# Patient Record
Sex: Female | Born: 1985 | Race: White | Hispanic: Yes | Marital: Single | Smoking: Never smoker
Health system: Southern US, Community
[De-identification: ages and names within clinical notes are randomized; demographics above are authoritative.]

## PROBLEM LIST (undated history)

## (undated) ENCOUNTER — Inpatient Hospital Stay (HOSPITAL_COMMUNITY): Payer: Self-pay

## (undated) DIAGNOSIS — G709 Myoneural disorder, unspecified: Secondary | ICD-10-CM

## (undated) DIAGNOSIS — R87629 Unspecified abnormal cytological findings in specimens from vagina: Secondary | ICD-10-CM

## (undated) DIAGNOSIS — T783XXA Angioneurotic edema, initial encounter: Secondary | ICD-10-CM

## (undated) HISTORY — DX: Unspecified abnormal cytological findings in specimens from vagina: R87.629

## (undated) HISTORY — DX: Angioneurotic edema, initial encounter: T78.3XXA

---

## 2005-01-22 ENCOUNTER — Emergency Department (HOSPITAL_COMMUNITY): Admission: EM | Admit: 2005-01-22 | Discharge: 2005-01-23 | Payer: Self-pay | Admitting: Emergency Medicine

## 2005-01-24 ENCOUNTER — Inpatient Hospital Stay (HOSPITAL_COMMUNITY): Admission: EM | Admit: 2005-01-24 | Discharge: 2005-01-27 | Payer: Self-pay | Admitting: Emergency Medicine

## 2005-01-25 ENCOUNTER — Ambulatory Visit: Payer: Self-pay | Admitting: Internal Medicine

## 2017-03-22 ENCOUNTER — Inpatient Hospital Stay (HOSPITAL_COMMUNITY): Payer: Self-pay

## 2017-03-22 ENCOUNTER — Inpatient Hospital Stay (HOSPITAL_COMMUNITY)
Admission: AD | Admit: 2017-03-22 | Discharge: 2017-03-22 | Disposition: A | Discharge status: Home / Self Care | Payer: Self-pay | Source: Ambulatory Visit | Attending: Family Medicine | Admitting: Family Medicine

## 2017-03-22 ENCOUNTER — Encounter (HOSPITAL_COMMUNITY): Payer: Self-pay

## 2017-03-22 DIAGNOSIS — O26899 Other specified pregnancy related conditions, unspecified trimester: Secondary | ICD-10-CM

## 2017-03-22 DIAGNOSIS — O26893 Other specified pregnancy related conditions, third trimester: Secondary | ICD-10-CM | POA: Insufficient documentation

## 2017-03-22 DIAGNOSIS — R109 Unspecified abdominal pain: Secondary | ICD-10-CM | POA: Insufficient documentation

## 2017-03-22 DIAGNOSIS — M6208 Separation of muscle (nontraumatic), other site: Secondary | ICD-10-CM

## 2017-03-22 DIAGNOSIS — Z3A28 28 weeks gestation of pregnancy: Secondary | ICD-10-CM | POA: Insufficient documentation

## 2017-03-22 DIAGNOSIS — N949 Unspecified condition associated with female genital organs and menstrual cycle: Secondary | ICD-10-CM

## 2017-03-22 DIAGNOSIS — R102 Pelvic and perineal pain: Secondary | ICD-10-CM | POA: Insufficient documentation

## 2017-03-22 HISTORY — DX: Myoneural disorder, unspecified: G70.9

## 2017-03-22 LAB — CBC
HCT: 36.6 % (ref 36.0–46.0)
HEMOGLOBIN: 12.2 g/dL (ref 12.0–15.0)
MCH: 29.3 pg (ref 26.0–34.0)
MCHC: 33.3 g/dL (ref 30.0–36.0)
MCV: 87.8 fL (ref 78.0–100.0)
PLATELETS: 203 10*3/uL (ref 150–400)
RBC: 4.17 MIL/uL (ref 3.87–5.11)
RDW: 13.6 % (ref 11.5–15.5)
WBC: 11.7 10*3/uL — ABNORMAL HIGH (ref 4.0–10.5)

## 2017-03-22 LAB — COMPREHENSIVE METABOLIC PANEL
ALBUMIN: 3.1 g/dL — AB (ref 3.5–5.0)
ALK PHOS: 71 U/L (ref 38–126)
ALT: 15 U/L (ref 14–54)
ANION GAP: 9 (ref 5–15)
AST: 15 U/L (ref 15–41)
BUN: 8 mg/dL (ref 6–20)
CHLORIDE: 104 mmol/L (ref 101–111)
CO2: 25 mmol/L (ref 22–32)
CREATININE: 0.56 mg/dL (ref 0.44–1.00)
Calcium: 9 mg/dL (ref 8.9–10.3)
GFR calc non Af Amer: >60 mL/min (ref >60)
GLUCOSE: 86 mg/dL (ref 65–99)
POTASSIUM: 4.5 mmol/L (ref 3.5–5.1)
SODIUM: 138 mmol/L (ref 135–145)
TOTAL PROTEIN: 7.1 g/dL (ref 6.5–8.1)
Total Bilirubin: 0.3 mg/dL (ref 0.3–1.2)

## 2017-03-22 LAB — ABO/RH: ABO/RH(D): O POS

## 2017-03-22 LAB — URINALYSIS, ROUTINE W REFLEX MICROSCOPIC
BILIRUBIN URINE: NEGATIVE
GLUCOSE, UA: NEGATIVE mg/dL
Hgb urine dipstick: NEGATIVE
KETONES UR: NEGATIVE mg/dL
LEUKOCYTES UA: NEGATIVE
Nitrite: NEGATIVE
PH: 6 (ref 5.0–8.0)
PROTEIN: NEGATIVE mg/dL
Specific Gravity, Urine: 1.014 (ref 1.005–1.030)

## 2017-03-22 MED ORDER — GI COCKTAIL ~~LOC~~
30.0000 mL | Freq: Once | ORAL | Status: AC
  Administered 2017-03-22: 30 mL via ORAL
  Filled 2017-03-22: qty 30

## 2017-03-22 NOTE — MAU Note (Signed)
Patient woke up at 0100 with sharp pain 10/10 across her mid and upper abdomen.  Hurts when she's trying to sit up.  No leaking fluid or bleeding. + FM.

## 2017-03-22 NOTE — MAU Provider Note (Signed)
History     CSN: [id number]  Arrival date and time: 03/22/17 5035   First Provider Initiated Contact with Patient 03/22/17 [phone]      Chief Complaint  Patient presents with  . Abdominal Pain   Paula Dixon is a 31 y.o. G1P0 at [redacted]w[redacted]d who presents today with abdominal pain that started about 3 hours prior to arrival. She denies any VB or LOF. She reports normal fetal movement. She was getting prenatal care in [place], and she just recently moved here. She had [place] on 8/7 that showed placenta previa. She states that on the [place] on 02/07/17 the previa had moved, but patient was advised to stay on bedrest.    Abdominal Pain  This is a new problem. The current episode started today. The onset quality is sudden. The problem occurs constantly. The problem has been unchanged. The pain is located in the epigastric region and periumbilical region. The pain is at a severity of 10/10. The quality of the pain is sharp. The abdominal pain does not radiate. Pertinent negatives include no dysuria, fever, frequency, nausea or vomiting. Exacerbated by: possible suspect food intake.  The pain is relieved by nothing.   Past Medical History:  Diagnosis Date  . Neuromuscular disorder ([place])     No past surgical history on file.  No family history on file.  Social History  Substance Use Topics  . Smoking status: Never Smoker  . Smokeless tobacco: Not on file  . Alcohol use Yes     Comment: sip of wine    Allergies: Not on File  No prescriptions prior to admission.    Review of Systems  Constitutional: Negative for chills and fever.  Gastrointestinal: Positive for abdominal pain. Negative for nausea and vomiting.  Genitourinary: Negative for dysuria, frequency, pelvic pain, urgency, vaginal bleeding and vaginal discharge.   Physical Exam   Blood pressure 116/68, pulse 91, temperature 97.9 F (36.6 C), temperature source Oral, resp. rate 18, height 5\' 4"  (1.626 m), weight 231 lb (104.8  kg).  Physical Exam  Nursing note and vitals reviewed. Constitutional: She is oriented to person, place, and time. She appears well-developed and well-nourished. No distress.  HENT:  Head: Normocephalic.  Cardiovascular: Normal rate.   Respiratory: Effort normal.  GI: Soft. There is no tenderness. There is no rebound.  Neurological: She is alert and oriented to person, place, and time.  Skin: Skin is warm and dry.  Psychiatric: She has a normal mood and affect.   [place]: Cervical length: 4cm AFI 69%tile Placenta: no previa, above cervical os.    Results for orders placed or performed during the hospital encounter of 03/22/17 (from the past 24 hour(s))  Urinalysis, Routine w reflex microscopic     Status: None   Collection Time: 03/22/17  3:23 AM  Result Value Ref Range   Color, Urine YELLOW YELLOW   APPearance CLEAR CLEAR   Specific Gravity, Urine 1.014 1.005 - 1.030   pH 6.0 5.0 - 8.0   Glucose, UA NEGATIVE NEGATIVE mg/dL   Hgb urine dipstick NEGATIVE NEGATIVE   Bilirubin Urine NEGATIVE NEGATIVE   Ketones, ur NEGATIVE NEGATIVE mg/dL   Protein, ur NEGATIVE NEGATIVE mg/dL   Nitrite NEGATIVE NEGATIVE   Leukocytes, UA NEGATIVE NEGATIVE  CBC     Status: Abnormal   Collection Time: 03/22/17  3:58 AM  Result Value Ref Range   WBC 11.7 (H) 4.0 - 10.5 K/uL   RBC 4.17 3.87 - 5.11 MIL/uL   Hemoglobin 12.2 12.0 -  15.0 g/dL   HCT 36.6 36.0 - 46.0 %   MCV 87.8 78.0 - 100.0 fL   MCH 29.3 26.0 - 34.0 pg   MCHC 33.3 30.0 - 36.0 g/dL   RDW 13.6 11.5 - 15.5 %   Platelets 203 150 - 400 K/uL   FHT: 130, moderate with 10x10 accels, no decels Toco: no UCs  MAU Course  Procedures  MDM   Assessment and Plan   1. Abdominal pain during pregnancy in third trimester   2. Pelvic pain affecting pregnancy   3. Round ligament pain   4. Diastasis recti   5. [redacted] weeks gestation of pregnancy    DC home Comfort measures reviewed  3rd Trimester precautions  PTL precautions  Fetal kick  counts RX: none Return to MAU as needed FU with OB as planned  [place] [facility] Follow up.   Specialty:  Obstetrics and Gynecology Why:  Call for an appointment  Contact information: [place] 03/22/2017, 3:49 AM

## 2017-03-22 NOTE — Discharge Instructions (Signed)
Prenatal Care Providers [facility]  Phone209 [phone]     Phone: [place]                      [facility]  @[place]     Phone: [phone]  Phone: [place]     Phone: [phone]  Phone: [place] [facility] @ [place]                hone: [phone]  Phone: [phone]         [facility] [address]      Phone: [phone]  Phone: [phone]         [place] [facility]                Phone: [phone]  [place])          Phone: [phone] [facility]   Phone: [phone] Round Ligament Pain during Pregnancy Many women will experience a type of pain referred to as round ligament pain during their pregnancy. This is associated with abdominal pain or discomfort. Since any type of abdominal pain during pregnancy can be disconcerting, it is important to talk about round ligament pain to relieve any anxiety or fears you may have regarding the symptoms you are feeling. Round ligament pain is due to normal changes that take place in the body during pregnancy. It is caused by stretching of the round ligaments attached to the uterus. More commonly it occurs on the right side of the pelvis. Round Ligament: An Overview Typically in the non-pregnant state the uterus is about the size of an apple or pear. There are thick ligaments which hold the uterus in place in the abdomen, referred to as round ligaments. During pregnancy, your uterus will expand in size and weight, and the ligaments supporting it will have to stretch, becoming longer and thinner. As these ligaments pull and tug they may irritate nearby nerve fibers, which causes pain. The severity of the  pain in some cases can seem extreme. Some common symptoms of round ligament pain include:  Ligament spasms or contractions/cramps that trigger a sharp pain typically on the right side of the abdomen.  Pain upon waking or suddenly rolling over in your sleep.  Pain in the abdomen that is sharp brought on by exercise or other vigorous activity. Similar Problems Round ligament pain is often mistaken for other medical conditions because the symptoms are similar. Acute abdominal pain during pregnancy may also be a sign of other conditions including:  Abdominal cramps - Some abdominal pain is simply caused by change in bowel habits associated with pregnancy. Gas is a common problem that can cause sharp, shooting pain. You should always seek out medical care if your pain is accompanied by fever, chills, pain upon urination or if you have difficulty walking. Further exams and tests will be conducted to ensure that you do not have a more serious condition. It is not uncommon for women with lower abdominal pain to have a urinary tract infection, thus you may also be asked for a urine sample.  Treatment If all other conditions are ruled out you can treat your round ligament pain relatively easily. You may be advised to take some acetaminophen (Tylenol) to reduce the severity of any persistent pain and asked to reduce your activity level. You can apply a heating pad to the area of pain or take a warm bath. Lying on the opposite side of the pain may help as well. Most women will find relief from round ligament pain simply by altering their daily routines slightly. The good news is round ligament pain will disappear completely once you have given birth to your child!

## 2017-09-01 ENCOUNTER — Encounter (HOSPITAL_COMMUNITY): Payer: Self-pay

## 2018-04-24 ENCOUNTER — Encounter: Payer: Self-pay | Admitting: Obstetrics & Gynecology

## 2019-10-04 ENCOUNTER — Other Ambulatory Visit: Payer: Self-pay

## 2019-10-04 ENCOUNTER — Other Ambulatory Visit (HOSPITAL_COMMUNITY)
Admission: RE | Admit: 2019-10-04 | Discharge: 2019-10-04 | Disposition: A | Discharge status: Home / Self Care | Payer: 59 | Source: Ambulatory Visit | Attending: Obstetrics & Gynecology | Admitting: Obstetrics & Gynecology

## 2019-10-04 ENCOUNTER — Encounter: Payer: Self-pay | Admitting: Obstetrics & Gynecology

## 2019-10-04 ENCOUNTER — Ambulatory Visit (INDEPENDENT_AMBULATORY_CARE_PROVIDER_SITE_OTHER): Payer: 59 | Admitting: Obstetrics & Gynecology

## 2019-10-04 VITALS — BP 108/73 | HR 73 | Ht 64.0 in | Wt 199.0 lb

## 2019-10-04 DIAGNOSIS — Z01419 Encounter for gynecological examination (general) (routine) without abnormal findings: Secondary | ICD-10-CM | POA: Insufficient documentation

## 2019-10-04 DIAGNOSIS — Z3202 Encounter for pregnancy test, result negative: Secondary | ICD-10-CM

## 2019-10-04 LAB — POCT URINE PREGNANCY: Preg Test, Ur: NEGATIVE

## 2019-10-04 NOTE — Progress Notes (Signed)
Subjective:     Brynnli Anzalone is a 34 y.o. female here for a routine exam. Current complaints: none.     Gynecologic History Patient's last menstrual period was 09/10/2019. Last Pap: 2018. Results were: normal Last mammogram: n/a  Obstetric History OB History  Gravida Para Term Preterm AB Living  3 1 1   2 1   SAB TAB Ectopic Multiple Live Births    1 1   1     # Outcome Date GA Lbr Len/2nd Weight Sex Delivery Anes PTL Lv  3 TAB 2020          2 Term 2018 [redacted]w[redacted]d   F Vag-Spont EPI N LIV  1 Ectopic 2016           The following portions of the patient's history were reviewed and updated as appropriate: allergies, current medications, past family history, past medical history, past social history, past surgical history and problem list.  Review of Systems Pertinent items are noted in HPI.    Objective:  BP 108/73   Pulse 73   Ht 5\' 4"  (1.626 m)   Wt 199 lb (90.3 kg)   LMP 09/10/2019   BMI 34.16 kg/m  General Appearance:    Alert, cooperative, no distress, appears stated age  Head:    Normocephalic, without obvious abnormality, atraumatic  Eyes:    conjunctiva/corneas clear, EOM's intact, both eyes  Ears:    Normal external ear canals, both ears  Nose:   Nares normal, septum midline, mucosa normal, no drainage    or sinus tenderness  Throat:   Lips, mucosa, and tongue normal; teeth and gums normal  Neck:   Supple, symmetrical, trachea midline, no adenopathy;    thyroid:  no enlargement/tenderness/nodules  Back:     Symmetric, no curvature, ROM normal, no CVA tenderness  Lungs:     respirations unlabored  Chest Wall:    No tenderness or deformity   Heart:    Regular rate and rhythm  Breast Exam:    No tenderness, masses, or nipple abnormality  Abdomen:     Soft, non-tender, bowel sounds active all four quadrants,    no masses, no organomegaly  Genitalia:    Normal female without lesion, discharge or tenderness     Extremities:   Extremities normal, atraumatic, no cyanosis or  edema  Pulses:   2+ and symmetric all extremities  Skin:   Skin color, texture, turgor normal, no rashes or lesions   Mardelle was seen today for gynecologic exam.  Diagnoses and all orders for this visit:  Well female exam with routine gynecological exam -     Cytology - PAP( [place]) -     HIV antibody (with reflex) -     RPR -     Hepatitis C Antibody -     Hepatitis B Surface AntiGEN -     POCT urine pregnancy  f/u in 1 year or sooner prn   Vlada Uriostegui L. Harraway-Smith, M.D., Cherlynn June

## 2019-10-05 LAB — HEPATITIS C ANTIBODY: Hep C Virus Ab: <0.1 s/co ratio (ref 0.0–0.9)

## 2019-10-05 LAB — HIV ANTIBODY (ROUTINE TESTING W REFLEX): HIV Screen 4th Generation wRfx: NONREACTIVE

## 2019-10-05 LAB — RPR: RPR Ser Ql: NONREACTIVE

## 2019-10-05 LAB — HEPATITIS B SURFACE ANTIGEN: Hepatitis B Surface Ag: NEGATIVE

## 2019-10-07 LAB — CYTOLOGY - PAP
Chlamydia: NEGATIVE
Comment: NEGATIVE
Comment: NEGATIVE
Comment: NORMAL
High risk HPV: POSITIVE — AB
Neisseria Gonorrhea: NEGATIVE

## 2019-10-11 ENCOUNTER — Encounter: Payer: Self-pay | Admitting: Obstetrics & Gynecology

## 2019-10-18 ENCOUNTER — Ambulatory Visit (INDEPENDENT_AMBULATORY_CARE_PROVIDER_SITE_OTHER): Payer: 59 | Admitting: Obstetrics & Gynecology

## 2019-10-18 ENCOUNTER — Other Ambulatory Visit: Payer: Self-pay | Admitting: Obstetrics & Gynecology

## 2019-10-18 ENCOUNTER — Other Ambulatory Visit: Payer: Self-pay

## 2019-10-18 VITALS — BP 114/76 | HR 72 | Wt 199.0 lb

## 2019-10-18 DIAGNOSIS — Z3202 Encounter for pregnancy test, result negative: Secondary | ICD-10-CM | POA: Diagnosis not present

## 2019-10-18 DIAGNOSIS — Z3042 Encounter for surveillance of injectable contraceptive: Secondary | ICD-10-CM

## 2019-10-18 DIAGNOSIS — Z3009 Encounter for other general counseling and advice on contraception: Secondary | ICD-10-CM

## 2019-10-18 LAB — POCT URINE PREGNANCY: Preg Test, Ur: NEGATIVE

## 2019-10-18 MED ORDER — MEDROXYPROGESTERONE ACETATE 150 MG/ML IM SUSP: 150.0000 mg | Freq: Once | INTRAMUSCULAR | Status: DC

## 2019-10-18 MED ORDER — MEDROXYPROGESTERONE ACETATE 150 MG/ML IM SUSP: 150.0000 mg | INTRAMUSCULAR | 0 refills | Status: DC

## 2019-10-18 MED ORDER — DROSPIRENONE-ETHINYL ESTRADIOL 3-0.03 MG PO TABS: 1.0000 | ORAL_TABLET | Freq: Every day | ORAL | 11 refills | Status: DC

## 2019-10-18 MED ORDER — NORGESTIMATE-ETH ESTRADIOL 0.25-35 MG-MCG PO TABS: 1.0000 | ORAL_TABLET | Freq: Every day | ORAL | 11 refills | Status: DC

## 2019-10-18 NOTE — Progress Notes (Signed)
Pt presented for Depo Provera but, her insurance does not cover it and she wants to discuss other option. Reviewed all forms of birth control options available including abstinence; over the counter/barrier methods; hormonal contraceptive medication including pill, patch, ring, injection,contraceptive implant; hormonal and nonhormonal IUDs; permanent sterilization options including vasectomy and the various tubal sterilization modalities. Risks and benefits reviewed.  Questions were answered.  Information was given to patient to review. Pt opts for OCPs. She prev has increased appetite and depression on them but, wants to try them again.   The use of the oral contraceptive has been fully discussed with the patient. This includes the proper method to initiate (i.e. Sunday start after next normal menstrual onset versus same day start) and continue the pills, the need for regular compliance to ensure adequate contraceptive effect, the physiology which make the pill effective, the instructions for what to do in event of a missed pill, and warnings about anticipated minor side effects such as breakthrough spotting, nausea, breast tenderness, weight changes, acne, headaches, etc.  She has been told of the more serious potential side effects such as MI, stroke, and deep vein thrombosis, all of which are very unlikely.  She has been asked to report any signs of such serious problems immediately.  She should back up the pill with a condom during any cycle in which antibiotics are prescribed, and during the first cycle as well. The need for additional protection, such as a condom, to prevent exposure to sexually transmitted diseases has also been discussed- the patient has been clearly reminded that OCP's cannot protect her against diseases such as HIV and others. She understands and wishes to take the medication as prescribed.She was given a prescription for Yasmin and will return in 3 months for BP and contraceptive  check.  Total face-to-face time with patient was 20 min.  Greater than 50% was spent in counseling and coordination of care with the patient.   Mickey Hebel L. Harraway-Smith, M.D., [place]nn June

## 2019-10-22 ENCOUNTER — Other Ambulatory Visit: Payer: Self-pay | Admitting: Family Medicine

## 2019-10-22 ENCOUNTER — Telehealth: Payer: Self-pay

## 2019-10-22 DIAGNOSIS — G43909 Migraine, unspecified, not intractable, without status migrainosus: Secondary | ICD-10-CM

## 2019-10-22 NOTE — Telephone Encounter (Signed)
Called number listed and when I asked for the patient the person replies "no".  Kathrene Alu RN

## 2019-10-22 NOTE — Telephone Encounter (Signed)
-----   Message from Lavonia Drafts, MD sent at 10/21/2019  7:07 PM EDT ----- Please call pt. She had an abnormal PAP and needs a colpo.   Please arrange.   Thanks,  Clh-S

## 2019-10-30 NOTE — Telephone Encounter (Signed)
Letter mailed to patient. Kathrene Alu RN

## 2019-11-06 ENCOUNTER — Ambulatory Visit
Admission: RE | Admit: 2019-11-06 | Discharge: 2019-11-06 | Disposition: A | Discharge status: Home / Self Care | Payer: 59 | Source: Ambulatory Visit | Attending: Family Medicine | Admitting: Family Medicine

## 2019-11-06 ENCOUNTER — Other Ambulatory Visit: Payer: Self-pay

## 2019-11-06 DIAGNOSIS — G43909 Migraine, unspecified, not intractable, without status migrainosus: Secondary | ICD-10-CM

## 2019-11-18 ENCOUNTER — Other Ambulatory Visit: Payer: Self-pay

## 2019-11-18 ENCOUNTER — Emergency Department (HOSPITAL_BASED_OUTPATIENT_CLINIC_OR_DEPARTMENT_OTHER)
Admission: EM | Admit: 2019-11-18 | Discharge: 2019-11-18 | Disposition: A | Discharge status: Home / Self Care | Payer: 59 | Attending: Emergency Medicine | Admitting: Emergency Medicine

## 2019-11-18 ENCOUNTER — Encounter (HOSPITAL_BASED_OUTPATIENT_CLINIC_OR_DEPARTMENT_OTHER): Payer: Self-pay

## 2019-11-18 ENCOUNTER — Emergency Department (HOSPITAL_BASED_OUTPATIENT_CLINIC_OR_DEPARTMENT_OTHER): Payer: 59

## 2019-11-18 DIAGNOSIS — H53149 Visual discomfort, unspecified: Secondary | ICD-10-CM | POA: Diagnosis not present

## 2019-11-18 DIAGNOSIS — M62838 Other muscle spasm: Secondary | ICD-10-CM | POA: Diagnosis not present

## 2019-11-18 DIAGNOSIS — G43909 Migraine, unspecified, not intractable, without status migrainosus: Secondary | ICD-10-CM | POA: Diagnosis not present

## 2019-11-18 DIAGNOSIS — R0789 Other chest pain: Secondary | ICD-10-CM | POA: Diagnosis present

## 2019-11-18 LAB — CBC
HCT: 42.4 % (ref 36.0–46.0)
Hemoglobin: 13.6 g/dL (ref 12.0–15.0)
MCH: 29.4 pg (ref 26.0–34.0)
MCHC: 32.1 g/dL (ref 30.0–36.0)
MCV: 91.8 fL (ref 80.0–100.0)
Platelets: 235 10*3/uL (ref 150–400)
RBC: 4.62 MIL/uL (ref 3.87–5.11)
RDW: 12.5 % (ref 11.5–15.5)
WBC: 6.1 10*3/uL (ref 4.0–10.5)
nRBC: 0 % (ref 0.0–0.2)

## 2019-11-18 LAB — BASIC METABOLIC PANEL
Anion gap: 7 (ref 5–15)
BUN: 13 mg/dL (ref 6–20)
CO2: 28 mmol/L (ref 22–32)
Calcium: 8.9 mg/dL (ref 8.9–10.3)
Chloride: 103 mmol/L (ref 98–111)
Creatinine, Ser: 0.78 mg/dL (ref 0.44–1.00)
GFR calc Af Amer: >60 mL/min (ref >60)
GFR calc non Af Amer: >60 mL/min (ref >60)
Glucose, Bld: 81 mg/dL (ref 70–99)
Potassium: 3.5 mmol/L (ref 3.5–5.1)
Sodium: 138 mmol/L (ref 135–145)

## 2019-11-18 LAB — HCG, SERUM, QUALITATIVE: Preg, Serum: NEGATIVE

## 2019-11-18 LAB — TROPONIN I (HIGH SENSITIVITY): Troponin I (High Sensitivity): <2 ng/L (ref <18)

## 2019-11-18 MED ORDER — DEXAMETHASONE SODIUM PHOSPHATE 10 MG/ML IJ SOLN
10.0000 mg | Freq: Once | INTRAMUSCULAR | Status: AC
  Administered 2019-11-18: 10 mg via INTRAVENOUS
  Filled 2019-11-18: qty 1

## 2019-11-18 MED ORDER — METHOCARBAMOL 500 MG PO TABS
500.0000 mg | ORAL_TABLET | Freq: Once | ORAL | Status: AC
  Administered 2019-11-18: 500 mg via ORAL
  Filled 2019-11-18: qty 1

## 2019-11-18 MED ORDER — METOCLOPRAMIDE HCL 5 MG/ML IJ SOLN
10.0000 mg | Freq: Once | INTRAMUSCULAR | Status: AC
  Administered 2019-11-18: 10 mg via INTRAVENOUS
  Filled 2019-11-18: qty 2

## 2019-11-18 MED ORDER — SODIUM CHLORIDE 0.9 % IV BOLUS
1000.0000 mL | Freq: Once | INTRAVENOUS | Status: AC
  Administered 2019-11-18: 1000 mL via INTRAVENOUS

## 2019-11-18 MED ORDER — DIPHENHYDRAMINE HCL 50 MG/ML IJ SOLN
25.0000 mg | Freq: Once | INTRAMUSCULAR | Status: AC
  Administered 2019-11-18: 25 mg via INTRAVENOUS
  Filled 2019-11-18: qty 1

## 2019-11-18 MED ORDER — KETOROLAC TROMETHAMINE 30 MG/ML IJ SOLN
30.0000 mg | Freq: Once | INTRAMUSCULAR | Status: AC
  Administered 2019-11-18: 30 mg via INTRAVENOUS
  Filled 2019-11-18: qty 1

## 2019-11-18 MED ORDER — METHOCARBAMOL 500 MG PO TABS
500.0000 mg | ORAL_TABLET | Freq: Two times a day (BID) | ORAL | 0 refills | Status: DC
Start: 2019-11-18 — End: 2020-10-08

## 2019-11-18 NOTE — ED Provider Notes (Signed)
[facility] EMERGENCY DEPARTMENT Provider Note   CSN: [id number] Arrival date & time: 11/18/19  4098     History Chief Complaint  Patient presents with  . Chest Pain  . Headache    Paula Dixon is a 35 y.o. female.  Paula Dixon is a 34 y.o. female with a history of migraines, who presents to the requirement for evaluation of chest pain and headache.  Patient states that over the past 2 weeks she has had 3 episodes of sharp chest pain.  Each time it has been located on her left upper chest and has been very brief.  She reports she has been getting frequent headaches and her most recent headache has been present for the past 2 to 3 days.  Headache was gradual in onset is located across the front of her head and feels like the typical headache that she gets.  She has been diagnosed with migraines, but does not take any regular medications for her migraines.  Denies associated visual changes, but does report light and sound sensitivity.  No nausea or vomiting.  No numbness weakness or tingling.  She does report that she has been experiencing some intermittent neck stiffness over the last 2 weeks as well but reports that she has been sleeping on an air mattress and wonders if that may be contributing she has not had any fevers or chills.  With associated 3 episodes of chest pain she thinks they were brought on by movement, she denies any associated shortness of breath, pain is not pleuritic or exertional.  No associated cough.  No abdominal pain.  Denies any current chest pain.  Reports her primary concern today is persistent migraine.  She reports that she started googling things and worried that it could all be related to her childbearing got more concerned.  No other aggravating or alleviating factors.        Past Medical History:  Diagnosis Date  . Neuromuscular disorder ([place])     There are no problems to display for this patient.   History reviewed. No pertinent surgical  history.   OB History    Gravida  3   Para  1   Term  1   Preterm      AB  2   Living  1     SAB      TAB  1   Ectopic  1   Multiple      Live Births  1           Family History  Problem Relation Age of Onset  . Hypertension Mother     Social History   Tobacco Use  . Smoking status: Never Smoker  . Smokeless tobacco: Never Used  Substance Use Topics  . Alcohol use: Yes    Comment: sip of wine  . Drug use: No    Home Medications Prior to Admission medications   Medication Sig Start Date End Date Taking? Authorizing Provider  CELLULOSE PO Take by mouth.    [provider]  drospirenone-ethinyl estradiol (YASMIN) 3-0.03 MG tablet Take 1 tablet by mouth daily. 10/18/19   Lavonia Drafts, MD  methocarbamol (ROBAXIN) 500 MG tablet Take 1 tablet (500 mg total) by mouth 2 (two) times daily. 11/18/19   Jacqlyn Larsen, PA-C  Multiple Vitamin (MULTIVITAMIN) capsule Take 1 capsule by mouth daily.    [provider]  norgestimate-ethinyl estradiol (ORTHO-CYCLEN) 0.25-35 MG-MCG tablet Take 1 tablet by mouth daily. 10/18/19  Lavonia Drafts, MD  SUMAtriptan (IMITREX) 50 MG tablet SMARTSIG:1 Tablet(s) By Mouth 1 to 2 Times Daily 10/08/19   [provider]    Allergies    Fish allergy  Review of Systems   Review of Systems  Constitutional: Negative for chills and fever.  HENT: Negative.   Eyes: Positive for photophobia.  Respiratory: Negative for cough and shortness of breath.   Cardiovascular: Positive for chest pain.  Gastrointestinal: Negative for abdominal pain, nausea and vomiting.  Genitourinary: Negative for dysuria.  Musculoskeletal: Positive for neck stiffness. Negative for arthralgias and myalgias.  Skin: Negative for color change and rash.  Neurological: Positive for headaches. Negative for dizziness, tremors, seizures, syncope, facial asymmetry, speech difficulty, weakness, light-headedness and numbness.  All  other systems reviewed and are negative.   Physical Exam Updated Vital Signs BP 97/68   Pulse 88   Resp (!) 21   Ht 5\' 5"  (1.651 m)   Wt 90.7 kg   LMP 11/18/2019   SpO2 99%   BMI 33.28 kg/m   Physical Exam Vitals and nursing note reviewed.  Constitutional:      General: She is not in acute distress.    Appearance: She is well-developed. She is not ill-appearing or diaphoretic.     Comments: Well-appearing and in no distress  HENT:     Head: Normocephalic and atraumatic.  Eyes:     General:        Right eye: No discharge.        Left eye: No discharge.     Pupils: Pupils are equal, round, and reactive to light.  Neck:     Meningeal: Brudzinski's sign and Kernig's sign absent.  Cardiovascular:     Rate and Rhythm: Normal rate and regular rhythm.     Pulses:          Radial pulses are 2+ on the right side and 2+ on the left side.       Dorsalis pedis pulses are 2+ on the right side and 2+ on the left side.     Heart sounds: Normal heart sounds. No murmur heard.  No friction rub. No gallop.   Pulmonary:     Effort: Pulmonary effort is normal. No respiratory distress.     Breath sounds: Normal breath sounds. No wheezing or rales.     Comments: Respirations equal and unlabored, patient able to speak in full sentences, lungs clear to auscultation bilaterally Chest:     Chest wall: No tenderness.  Abdominal:     General: Bowel sounds are normal. There is no distension.     Palpations: Abdomen is soft. There is no mass.     Tenderness: There is no abdominal tenderness. There is no guarding.     Comments: Abdomen soft, nondistended, nontender to palpation in all quadrants without guarding or peritoneal signs  Musculoskeletal:        General: No deformity.     Cervical back: Normal range of motion and neck supple. No rigidity.     Right lower leg: No tenderness. No edema.     Left lower leg: No tenderness. No edema.  Skin:    General: Skin is warm and dry.     Capillary  Refill: Capillary refill takes less than 2 seconds.  Neurological:     Mental Status: She is alert and oriented to person, place, and time.     GCS: GCS eye subscore is 4. GCS verbal subscore is 5. GCS motor subscore is 6.  Coordination: Coordination normal.     Comments: Speech is clear, able to follow commands CN III-XII intact Normal strength in upper and lower extremities bilaterally including dorsiflexion and plantar flexion, strong and equal grip strength Sensation normal to light and sharp touch Moves extremities without ataxia, coordination intact  Psychiatric:        Mood and Affect: Mood normal.        Behavior: Behavior normal.     ED Results / Procedures / Treatments   Labs (all labs ordered are listed, but only abnormal results are displayed) Labs Reviewed  BASIC METABOLIC PANEL  CBC  HCG, SERUM, QUALITATIVE  TROPONIN I (HIGH SENSITIVITY)    EKG EKG Interpretation  Date/Time:  Monday November 18 2019 08:25:34 EDT Ventricular Rate:  72 PR Interval:    QRS Duration: 88 QT Interval:  397 QTC Calculation: 435 R Axis:   45 Text Interpretation: Sinus rhythm Confirmed by Virgel Manifold [phone]) on 11/18/2019 8:56:11 AM   Radiology DG Chest 2 View  Result Date: 11/18/2019 CLINICAL DATA:  Chest pain for 1 week. Pain radiates into the left arm. EXAM: CHEST - 2 VIEW COMPARISON:  None. FINDINGS: The heart size and mediastinal contours are normal. The lungs are clear. There is no pleural effusion or pneumothorax. No acute osseous findings are identified. Telemetry leads overlie the chest. IMPRESSION: No active cardiopulmonary process. Electronically Signed   By: Richardean Sale M.D.   On: 11/18/2019 09:40    Procedures Procedures (including critical care time)  Medications Ordered in ED Medications  ketorolac (TORADOL) 30 MG/ML injection 30 mg (30 mg Intravenous Given 11/18/19 1050)  metoCLOPramide (REGLAN) injection 10 mg (10 mg Intravenous Given 11/18/19 1056)    diphenhydrAMINE (BENADRYL) injection 25 mg (25 mg Intravenous Given 11/18/19 1053)  sodium chloride 0.9 % bolus 1,000 mL (0 mLs Intravenous Stopped 11/18/19 1210)  dexamethasone (DECADRON) injection 10 mg (10 mg Intravenous Given 11/18/19 1059)  methocarbamol (ROBAXIN) tablet 500 mg (500 mg Oral Given 11/18/19 1049)    ED Course  I have reviewed the triage vital signs and the nursing notes.  Pertinent labs & imaging results that were available during my care of the patient were reviewed by me and considered in my medical decision making (see chart for details).    MDM Rules/Calculators/A&P                         34 year old female presents with headache, chest pain and neck stiffness.  Headache has been present over the last few days and feels like her typical migraine, it came on gradually and is persistent, associated with photophobia but no neurologic deficits.  Chest pain is described as sharp brief intermittent pain but has happened 3 times over the past 2 weeks, no current chest pain, no previous cardiac history or risk factors for DVT.  Neck pain has been present for 2 weeks, patient has been sleeping on an air mattress.  She does not have any meningeal signs and has had no fevers to suggest neck stiffness not related to meningitis.  Chest pain evaluation with labs, EKG and chest x-ray initiated from triage, I have very low suspicion chest pain is related to ACS or other more serious etiology and patient is currently chest pain-free.  Will treat with headache cocktail.  Muscle relaxer to help with neck stiffness.  I have independently ordered, reviewed and interpreted all labs and imaging: Labs unremarkable, no leukocytosis or electrolyte derangements, troponin negative EKG  shows sinus rhythm without ischemic changes.  Chest x-ray with no active cardiopulmonary disease  On reevaluation after headache cocktail patient's headache has completely resolved.  She has had no further episodes of  chest pain, I suspect chest and neck pain may be related to sleeping on an air mattress.  At this time there does not appear to be any evidence of an acute emergency medical condition and the patient appears stable for discharge with appropriate outpatient follow up.Diagnosis was discussed with patient who verbalizes understanding and is agreeable to discharge.    Final Clinical Impression(s) / ED Diagnoses Final diagnoses:  Migraine without status migrainosus, not intractable, unspecified migraine type  Atypical chest pain  Muscle spasms of neck    Rx / DC Orders ED Discharge Orders         Ordered    methocarbamol (ROBAXIN) 500 MG tablet  2 times daily     Discontinue  Reprint     11/18/19 1216           Jacqlyn Larsen, [place] 11/23/19 1038    Virgel Manifold, MD 11/26/19 1456

## 2019-11-18 NOTE — Discharge Instructions (Signed)
I am glad that your headache has resolved.  I suspect that your migraines are causing your nausea and increased sense of smell.  Your pregnancy test today was negative.  Your chest pain is likely musculoskeletal and may be related to sleeping on an air mattress.  Your work-up today did not show a problem with your heart or lungs.  You can use prescribed muscle relaxer as well as ibuprofen or Aleve and Tylenol to help with pain.  Spasms in your neck may also be triggering your migraines, Robaxin should help.  This medication can cause drowsiness, do not take before driving.  Use the phone number provided to help establish care with the PCP, I have also provided information for neurology and ophthalmology.  You can call to schedule an appointment.

## 2019-11-18 NOTE — ED Triage Notes (Signed)
Pt also states concern for pregnancy and c/o stiff neck.

## 2019-11-18 NOTE — ED Triage Notes (Signed)
Pt arrives with c/o CP X1 week across her chest with radiation into left arm.

## 2019-11-18 NOTE — ED Notes (Signed)
Pt on monitor 

## 2019-12-25 ENCOUNTER — Telehealth: Payer: Self-pay

## 2019-12-25 NOTE — Telephone Encounter (Signed)
Patient called for follow up of OCPs in August 2021. Patient states she stopped taking them about three weeks ago due to mirgraines and she does not plan on going back on any birth control. Patient made aware that she will be due for next annual exam around  Oct 04, 2020.  Patient states understanding. Kathrene Alu RN

## 2020-01-09 ENCOUNTER — Encounter: Payer: Self-pay | Admitting: *Deleted

## 2020-01-09 ENCOUNTER — Other Ambulatory Visit: Payer: Self-pay

## 2020-01-10 ENCOUNTER — Other Ambulatory Visit: Payer: Self-pay

## 2020-01-10 ENCOUNTER — Encounter: Payer: Self-pay | Admitting: Neurology

## 2020-01-10 ENCOUNTER — Ambulatory Visit (INDEPENDENT_AMBULATORY_CARE_PROVIDER_SITE_OTHER): Payer: 59 | Admitting: Neurology

## 2020-01-10 DIAGNOSIS — G43019 Migraine without aura, intractable, without status migrainosus: Secondary | ICD-10-CM | POA: Diagnosis not present

## 2020-01-10 HISTORY — DX: Migraine without aura, intractable, without status migrainosus: G43.019

## 2020-01-10 MED ORDER — TOPIRAMATE 25 MG PO TABS: ORAL_TABLET | ORAL | 3 refills | Status: DC

## 2020-01-10 MED ORDER — SUMATRIPTAN SUCCINATE 100 MG PO TABS
ORAL_TABLET | ORAL | 3 refills | Status: DC
Start: 2020-01-10 — End: 2020-10-08

## 2020-01-10 NOTE — Progress Notes (Signed)
Reason for visit: Migraine headache  Referring physician: Dr. Ky Barban is a 34 y.o. female  History of present illness:  Paula Dixon is a 34 year old right-handed Hispanic female with a history of headaches.  The patient has had occasional headaches throughout her life, usually having no more than 3 or so a year, but beginning in November 2020 she began having an increase in frequency of headaches.  Initially the headaches were occurring 2-3 times a week, but beginning in April 2021 the headaches began to become daily in nature.  The headaches may be all over the head, she may have soreness on the top of the head and sometimes the pain is more severe on the right or left frontotemporal area.  The patient may have occasional nausea but usually not.  She may have photophobia without phonophobia.  She does report some neck stiffness with the headache.  She may have some blurring of vision, but she denies any muffled hearing or pulsatile tinnitus.  The patient does not note any particular activating factors other than if she gets too hot or too cold this may bring on a headache.  Over the last year she has gained anywhere from 30 to 50 pounds.  The patient reports some low back pain as well.  She was on birth control pills that were started at the end of June 2020, she has gone off of these pills over the last couple weeks.  She denies any family history of migraine headache.  She currently is taking over-the-counter medications such as Tylenol, Aleve, or Excedrin Migraine for the headache.  She went to the emergency room on 18 November 2019, she has some chest pain as well along with a headache.  CT scan of the brain was done and was unremarkable.  The patient was placed on Topamax and Imitrex but never really consistently took either 1 of these medications.  After coming off the birth control pills, her headaches seemed to improve transiently, but over the last 2 days the headache has been more  severe.  She may wake up with a headache at times.  She reports occasional transient numbness and tingling of the arms or legs, but this is not a persistent feature, no weakness is noted.  Past Medical History:  Diagnosis Date  . Common migraine with intractable migraine 01/10/2020  . Neuromuscular disorder ([place])     History reviewed. No pertinent surgical history.  Family History  Problem Relation Age of Onset  . Hypertension Mother   . Breast cancer Other     Social history:  reports that she has never smoked. She has never used smokeless tobacco. She reports current alcohol use. She reports that she does not use drugs.  Medications:  Prior to Admission medications   Medication Sig Start Date End Date Taking? Authorizing Provider  CELLULOSE PO Take by mouth.    [provider]  drospirenone-ethinyl estradiol (YASMIN) 3-0.03 MG tablet Take 1 tablet by mouth daily. 10/18/19   Lavonia Drafts, MD  methocarbamol (ROBAXIN) 500 MG tablet Take 1 tablet (500 mg total) by mouth 2 (two) times daily. 11/18/19   Jacqlyn Larsen, PA-C  Multiple Vitamin (MULTIVITAMIN) capsule Take 1 capsule by mouth daily.    [provider]  norgestimate-ethinyl estradiol (ORTHO-CYCLEN) 0.25-35 MG-MCG tablet Take 1 tablet by mouth daily. 10/18/19   Lavonia Drafts, MD  SUMAtriptan (IMITREX) 50 MG tablet SMARTSIG:1 Tablet(s) By Mouth 1 to 2 Times Daily 10/08/19   [provider]  topiramate (TOPAMAX) 25 MG tablet Take 25 mg by mouth at bedtime. 11/14/19   [provider]      Allergies  Allergen Reactions  . Fish Allergy Anaphylaxis    ROS:  Out of a complete 14 system review of symptoms, the patient complains only of the following symptoms, and all other reviewed systems are negative.  Headache Paresthesias Neck stiffness  Blood pressure 108/74, pulse 88, height 5\' 5"  (1.651 m), weight 203 lb 9.6 oz (92.4 kg), unknown if currently breastfeeding.  Physical  Exam  General: The patient is alert and cooperative at the time of the examination.  The patient is moderately obese.  Eyes: Pupils are equal, round, and reactive to light. Discs are flat bilaterally.  No venous pulsations were seen.  Neck: The neck is supple, no carotid bruits are noted.  Respiratory: The respiratory examination is clear.  Cardiovascular: The cardiovascular examination reveals a regular rate and rhythm, no obvious murmurs or rubs are noted.  Neuromuscular: The patient has fairly full range of movement cervical spine, no crepitus is noted in the temporomandibular joints on either side.  Skin: Extremities are without significant edema.  Neurologic Exam  Mental status: The patient is alert and oriented x 3 at the time of the examination. The patient has apparent normal recent and remote memory, with an apparently normal attention span and concentration ability.  Cranial nerves: Facial symmetry is present. There is good sensation of the face to pinprick and soft touch bilaterally. The strength of the facial muscles and the muscles to head turning and shoulder shrug are normal bilaterally. Speech is well enunciated, no aphasia or dysarthria is noted. Extraocular movements are full. Visual fields are full. The tongue is midline, and the patient has symmetric elevation of the soft palate. No obvious hearing deficits are noted.  Motor: The motor testing reveals 5 over 5 strength of all 4 extremities. Good symmetric motor tone is noted throughout.  Sensory: Sensory testing is intact to pinprick, soft touch, vibration sensation, and position sense on all 4 extremities, with exception that there is some decrease in position sense in the right foot. No evidence of extinction is noted.  Coordination: Cerebellar testing reveals good finger-nose-finger and heel-to-shin bilaterally.  Gait and station: Gait is normal. Tandem gait is normal. Romberg is negative. No drift is  seen.  Reflexes: Deep tendon reflexes are symmetric and normal bilaterally. Toes are downgoing bilaterally.   CT head 11/06/19:  IMPRESSION: Unremarkable non-contrast CT appearance of the brain. No evidence of acute intracranial abnormality.  * CT scan images were reviewed online. I agree with the written report.    Assessment/Plan:  1.  Converted migraine headache  The patient is having almost daily headaches at this point.  She will go back on Topamax, increase the dose gradually to 75 mg at night, she will take Imitrex 100 mg tablets if needed and she may take over-the-counter medication for headache such as Excedrin Migraine.  The patient drinks 1 caffeinated soft drink daily, otherwise no caffeine intake.  She will follow-up here in 3 months, she will call for any dose adjustments.  Paula Alexanders MD 01/10/2020 8:41 AM  [facility] [address] [place], [place] [address]  Phone [phone] Fax [phone]

## 2020-01-10 NOTE — Patient Instructions (Signed)
We will start Topamax for the headache, use imitrex if needed.  Topamax (topiramate) is a seizure medication that has an FDA approval for seizures and for migraine headache. Potential side effects of this medication include weight loss, cognitive slowing, tingling in the fingers and toes, and carbonated drinks will taste bad. If any significant side effects are noted on this drug, please contact our office.

## 2020-01-17 ENCOUNTER — Ambulatory Visit: Payer: 59 | Admitting: Obstetrics & Gynecology

## 2020-01-22 ENCOUNTER — Emergency Department (HOSPITAL_BASED_OUTPATIENT_CLINIC_OR_DEPARTMENT_OTHER)
Admission: EM | Admit: 2020-01-22 | Discharge: 2020-01-22 | Disposition: A | Discharge status: Home / Self Care | Payer: 59 | Attending: Emergency Medicine | Admitting: Emergency Medicine

## 2020-01-22 ENCOUNTER — Encounter (HOSPITAL_BASED_OUTPATIENT_CLINIC_OR_DEPARTMENT_OTHER): Payer: Self-pay

## 2020-01-22 ENCOUNTER — Other Ambulatory Visit: Payer: Self-pay

## 2020-01-22 DIAGNOSIS — J069 Acute upper respiratory infection, unspecified: Secondary | ICD-10-CM | POA: Diagnosis not present

## 2020-01-22 DIAGNOSIS — Z20822 Contact with and (suspected) exposure to covid-19: Secondary | ICD-10-CM | POA: Insufficient documentation

## 2020-01-22 DIAGNOSIS — R05 Cough: Secondary | ICD-10-CM | POA: Diagnosis present

## 2020-01-22 LAB — SARS CORONAVIRUS 2 BY RT PCR (HOSPITAL ORDER, PERFORMED IN ~~LOC~~ HOSPITAL LAB): SARS Coronavirus 2: NEGATIVE

## 2020-01-22 MED ORDER — BENZONATATE 100 MG PO CAPS
100.0000 mg | ORAL_CAPSULE | Freq: Three times a day (TID) | ORAL | 0 refills | Status: DC | PRN
Start: 2020-01-22 — End: 2020-10-08

## 2020-01-22 NOTE — ED Triage Notes (Signed)
Pt c/o flu like sx x 3 days-states she has had "migraine for months"-NAD-steady gait

## 2020-01-22 NOTE — Discharge Instructions (Signed)
You were seen in the emergency room today with cough and body aches.  I suspect that you are developing a respiratory infection.  Your COVID-19 test is negative today.  Please remain at home and away from others until you are feeling better for at least 3 days.  I have written a work note to this effect.  You can take the cough medication called into your pharmacy as prescribed.  Please drink plenty of fluids and take Tylenol and/or Motrin by following the dosing instructions on the packaging.  Return to the emergency department any new or suddenly worsening symptoms.

## 2020-01-22 NOTE — ED Provider Notes (Signed)
Emergency Department Provider Note   I have reviewed the triage vital signs and the nursing notes.   HISTORY  Chief Complaint Cough   HPI Paula Dixon is a 34 y.o. female with past medical history of migraine presents with intermittent headache symptoms, cough, body aches, chills.  Patient has had symptoms over the past 3 days.  Her younger daughter is here with her with similar symptoms as well.  The patient's child started back to school recently and mom developed symptoms shortly after the child.  She denies any chest pain or shortness of breath.  No abdominal pain, vomiting, diarrhea.  No COVID-19 contacts.  Denies any numbness or weakness.  She is been taking her migraine medication with only mild relief in symptoms.  No radiation of symptoms with modifying factors.  Past Medical History:  Diagnosis Date  . Common migraine with intractable migraine 01/10/2020  . Neuromuscular disorder [facility])     Patient Active Problem List   Diagnosis Date Noted  . Common migraine with intractable migraine 01/10/2020    History reviewed. No pertinent surgical history.  Allergies Fish allergy  Family History  Problem Relation Age of Onset  . Hypertension Mother   . Breast cancer Other     Social History Social History   Tobacco Use  . Smoking status: Never Smoker  . Smokeless tobacco: Never Used  Vaping Use  . Vaping Use: Never used  Substance Use Topics  . Alcohol use: Yes    Comment: occ  . Drug use: No    Review of Systems  Constitutional: Subjective fever/chills and body aches.  Eyes: No visual changes. ENT: No sore throat. Cardiovascular: Denies chest pain. Respiratory: Denies shortness of breath. Positive cough.  Gastrointestinal: No abdominal pain.  No nausea, no vomiting.  No diarrhea.  No constipation. Genitourinary: Negative for dysuria. Musculoskeletal: Negative for back pain. Skin: Negative for rash. Neurological: Negative for focal weakness or numbness.  Positive breakthrough migraine type HA.   10-point ROS otherwise negative.  ____________________________________________   PHYSICAL EXAM:  VITAL SIGNS: ED Triage Vitals  Enc Vitals Group     BP 01/22/20 2106 105/79     Pulse Rate 01/22/20 2106 75     Resp 01/22/20 2106 16     Temp 01/22/20 2106 98.2 F (36.8 C)     Temp Source 01/22/20 2106 Oral     SpO2 01/22/20 2106 99 %     Weight 01/22/20 2057 205 lb 11 oz (93.3 kg)     Height 01/22/20 2057 5\' 5"  (1.651 m)   Constitutional: Alert and oriented. Well appearing and in no acute distress. Eyes: Conjunctivae are normal. Head: Atraumatic. Nose: Mild congestion/rhinnorhea. Mouth/Throat: Mucous membranes are moist.  Neck: No stridor.  No meningeal signs. Cardiovascular: Normal rate, regular rhythm. Good peripheral circulation. Grossly normal heart sounds.   Respiratory: Normal respiratory effort.  No retractions. Lungs CTAB. Gastrointestinal: Soft and nontender. No distention.  Musculoskeletal: No gross deformities of extremities. Neurologic:  Normal speech and language.  Skin:  Skin is warm, dry and intact. No rash noted.   ____________________________________________   LABS (all labs ordered are listed, but only abnormal results are displayed)  Labs Reviewed  SARS CORONAVIRUS 2 BY RT PCR (HOSPITAL ORDER, [place] [facility])   ____________________________________________  RADIOLOGY  None  ____________________________________________   PROCEDURES  Procedure(s) performed:   Procedures  None  ____________________________________________   INITIAL IMPRESSION / ASSESSMENT AND PLAN / ED COURSE  Pertinent labs & imaging  results that were available during my care of the patient were reviewed by me and considered in my medical decision making (see chart for details).   Patient presents to the emergency department with flulike symptoms over the past 3 days.  She is having breakthrough migraine  type headaches.  No meningeal signs on exam.  Covid test here is negative.  Lungs are clear with no hypoxemia.  Suspect underlying viral infection.  I do not see an indication for chest imaging at this time.  Advised the patient to quarantine and manage symptoms with close PCP follow up.    ____________________________________________  FINAL CLINICAL IMPRESSION(S) / ED DIAGNOSES  Final diagnoses:  Viral URI with cough    NEW OUTPATIENT MEDICATIONS STARTED DURING THIS VISIT:  Discharge Medication List as of 01/22/2020 10:44 PM    START taking these medications   Details  benzonatate (TESSALON) 100 MG capsule Take 1 capsule (100 mg total) by mouth 3 (three) times daily as needed for cough., Starting Wed 01/22/2020, Normal        Note:  This document was prepared using Dragon voice recognition software and may include unintentional dictation errors.  Nanda Quinton, MD, [facility]    Cecillia Menees, [facility], MD 01/23/20 [phone]

## 2020-03-03 ENCOUNTER — Other Ambulatory Visit (HOSPITAL_COMMUNITY)
Admission: RE | Admit: 2020-03-03 | Discharge: 2020-03-03 | Disposition: A | Discharge status: Home / Self Care | Payer: 59 | Source: Ambulatory Visit | Attending: Advanced Practice Midwife | Admitting: Advanced Practice Midwife

## 2020-03-03 ENCOUNTER — Other Ambulatory Visit: Payer: Self-pay

## 2020-03-03 ENCOUNTER — Encounter: Payer: Self-pay | Admitting: Advanced Practice Midwife

## 2020-03-03 ENCOUNTER — Ambulatory Visit (INDEPENDENT_AMBULATORY_CARE_PROVIDER_SITE_OTHER): Payer: 59 | Admitting: Advanced Practice Midwife

## 2020-03-03 VITALS — BP 103/80 | HR 80 | Ht 65.0 in | Wt 206.0 lb

## 2020-03-03 DIAGNOSIS — M545 Low back pain, unspecified: Secondary | ICD-10-CM

## 2020-03-03 DIAGNOSIS — G43809 Other migraine, not intractable, without status migrainosus: Secondary | ICD-10-CM

## 2020-03-03 DIAGNOSIS — Z113 Encounter for screening for infections with a predominantly sexual mode of transmission: Secondary | ICD-10-CM

## 2020-03-03 DIAGNOSIS — R102 Pelvic and perineal pain: Secondary | ICD-10-CM

## 2020-03-03 DIAGNOSIS — F32A Depression, unspecified: Secondary | ICD-10-CM

## 2020-03-03 DIAGNOSIS — F329 Major depressive disorder, single episode, unspecified: Secondary | ICD-10-CM | POA: Diagnosis not present

## 2020-03-03 DIAGNOSIS — Z30011 Encounter for initial prescription of contraceptive pills: Secondary | ICD-10-CM | POA: Insufficient documentation

## 2020-03-03 DIAGNOSIS — Z202 Contact with and (suspected) exposure to infections with a predominantly sexual mode of transmission: Secondary | ICD-10-CM

## 2020-03-03 DIAGNOSIS — G8929 Other chronic pain: Secondary | ICD-10-CM

## 2020-03-03 MED ORDER — DROSPIRENONE-ETHINYL ESTRADIOL 3-0.03 MG PO TABS
1.0000 | ORAL_TABLET | Freq: Every day | ORAL | 11 refills | Status: DC
Start: 2020-03-03 — End: 2020-10-08
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
## 2020-03-03 NOTE — Patient Instructions (Signed)
Depression Screening Depression screening is a tool that your health care provider can use to learn if you have symptoms of depression. Depression is a common condition with many symptoms that are also often found in other conditions. Depression is treatable, but it must first be diagnosed. You may not know that certain feelings, thoughts, and behaviors that you are having can be symptoms of depression. Taking a depression screening test can help you and your health care provider decide if you need more assessment, or if you should be referred to a mental health care provider. What are the screening tests?  You may have a physical exam to see if another condition is affecting your mental health. You may have a blood or urine sample taken during the physical exam.  You may be interviewed using a screening tool that was developed from research, such as one of these: ? Patient Health Questionnaire (PHQ). This is a set of either 2 or 9 questions. A health care provider who has been trained to score this screening test uses a guide to assess if your symptoms suggest that you may have depression. ? Hamilton Depression Rating Scale (HAM-D). This is a set of either 17 or 24 questions. You may be asked to take it again during or after your treatment, to see if your depression has gotten better. ? Beck Depression Inventory (BDI). This is a set of 21 multiple choice questions. Your health care provider scores your answers to assess:  Your level of depression, ranging from mild to severe.  Your response to treatment.  Your health care provider may talk with you about your daily activities, such as eating, sleeping, work, and recreation, and ask if you have had any changes in activity.  Your health care provider may ask you to see a mental health specialist, such as a psychiatrist or psychologist, for more evaluation. Who should be screened for depression?   All adults, including adults with a family history  of a mental health disorder.  Adolescents who are 12-18 years old.  People who are recovering from a myocardial infarction (MI).  Pregnant women, or women who have given birth.  People who have a long-term (chronic) illness.  Anyone who has been diagnosed with another type of a mental health disorder.  Anyone who has symptoms that could show depression. What do my results mean? Your health care provider will review the results of your depression screening, physical exam, and lab tests. Positive screens suggest that you may have depression. Screening is the first step in getting the care that you may need. It is up to you to get your screening results. Ask your health care provider, or the department that is doing your screening tests, when your results will be ready. Talk with your health care provider about your results and diagnosis. A diagnosis of depression is made using the Diagnostic and Statistical Manual of Mental Disorders (DSM-V). This is a book that lists the number and type of symptoms that must be present for a health care provider to give a specific diagnosis.  Your health care provider may work with you to treat your symptoms of depression, or your health care provider may help you find a mental health provider who can assess, diagnose, and treat your depression. Get help right away if:  You have thoughts about hurting yourself or others. If you ever feel like you may hurt yourself or others, or have thoughts about taking your own life, get help right away. You   can go to your nearest emergency department or call:  Your local emergency services (911 in the [place]  A suicide crisis helpline, such as the [facility] at 518-078-6489. This is open 24 hours a day. Summary  Depression screening is the first step in getting the help that you may need.  If your screening test shows symptoms of depression (is positive), your health care provider may ask  you to see a mental health provider.  Anyone who is age 58 or older should be screened for depression. This information is not intended to replace advice given to you by your health care provider. Make sure you discuss any questions you have with your health care provider. Document Revised: 05/05/2017 Document Reviewed: 10/07/2016 Elsevier Patient Education  [place]. Chronic Back Pain When back pain lasts longer than 3 months, it is called chronic back pain.The cause of your back pain may not be known. Some common causes include:  Wear and tear (degenerative disease) of the bones, ligaments, or disks in your back.  Inflammation and stiffness in your back (arthritis). People who have chronic back pain often go through certain periods in which the pain is more intense (flare-ups). Many people can learn to manage the pain with home care. Follow these instructions at home: Pay attention to any changes in your symptoms. Take these actions to help with your pain: Activity   Avoid bending and other activities that make the problem worse.  Maintain a proper position when standing or sitting: ? When standing, keep your upper back and neck straight, with your shoulders pulled back. Avoid slouching. ? When sitting, keep your back straight and relax your shoulders. Do not round your shoulders or pull them backward.  Do not sit or stand in one place for long periods of time.  Take brief periods of rest throughout the day. This will reduce your pain. Resting in a lying or standing position is usually better than sitting to rest.  When you are resting for longer periods, mix in some mild activity or stretching between periods of rest. This will help to prevent stiffness and pain.  Get regular exercise. Ask your health care provider what activities are safe for you.  Do not lift anything that is heavier than 10 lb (4.5 kg). Always use proper lifting technique, which includes: ? Bending  your knees. ? Keeping the load close to your body. ? Avoiding twisting.  Sleep on a firm mattress in a comfortable position. Try lying on your side with your knees slightly bent. If you lie on your back, put a pillow under your knees. Managing pain  If directed, apply ice to the painful area. Your health care provider may recommend applying ice during the first 24-48 hours after a flare-up begins. ? Put ice in a plastic bag. ? Place a towel between your skin and the bag. ? Leave the ice on for 20 minutes, 2-3 times per day.  If directed, apply heat to the affected area as often as told by your health care provider. Use the heat source that your health care provider recommends, such as a moist heat pack or a heating pad. ? Place a towel between your skin and the heat source. ? Leave the heat on for 20-30 minutes. ? Remove the heat if your skin turns bright red. This is especially important if you are unable to feel pain, heat, or cold. You may have a greater risk of getting burned.  Try soaking  in a warm tub.  Take over-the-counter and prescription medicines only as told by your health care provider.  Keep all follow-up visits as told by your health care provider. This is important. Contact a health care provider if:  You have pain that is not relieved with rest or medicine. Get help right away if:  You have weakness or numbness in one or both of your legs or feet.  You have trouble controlling your bladder or your bowels.  You have nausea or vomiting.  You have pain in your abdomen.  You have shortness of breath or you faint. This information is not intended to replace advice given to you by your health care provider. Make sure you discuss any questions you have with your health care provider. Document Revised: 09/13/2018 Document Reviewed: 11/30/2016 Elsevier Patient Education  [place] Sex Practicing safe sex means taking steps before and during sex to reduce  your risk of:  Getting an STI (sexually transmitted infection).  Giving your partner an STI.  Unwanted or unplanned pregnancy. How can I practice safe sex?     Ways you can practice safe sex  Limit your sexual partners to only one partner who is having sex with only you.  Avoid using alcohol and drugs before having sex. Alcohol and drugs can affect your judgment.  Before having sex with a new partner: ? Talk to your partner about past partners, past STIs, and drug use. ? Get screened for STIs and discuss the results with your partner. Ask your partner to get screened, too.  Check your body regularly for sores, blisters, rashes, or unusual discharge. If you notice any of these problems, visit your health care provider.  Avoid sexual contact if you have symptoms of an infection or you are being treated for an STI.  While having sex, use a condom. Make sure to: ? Use a condom every time you have vaginal, oral, or anal sex. Both females and males should wear condoms during oral sex. ? Keep condoms in place from the beginning to the end of sexual activity. ? Use a latex condom, if possible. Latex condoms offer the best protection. ? Use only water-based lubricants with a condom. Using petroleum-based lubricants or oils will weaken the condom and increase the chance that it will break. Ways your health care provider can help you practice safe sex  See your health care provider for regular screenings, exams, and tests for STIs.  Talk with your health care provider about what kind of birth control (contraception) is best for you.  Get vaccinated against hepatitis B and human papillomavirus (HPV).  If you are at risk of being infected with HIV (human immunodeficiency virus), talk with your health care provider about taking a prescription medicine to prevent HIV infection. You are at risk for HIV if you: ? Are a man who has sex with other men. ? Are sexually active with more than one  partner. ? Take drugs by injection. ? Have a sex partner who has HIV. ? Have unprotected sex. ? Have sex with someone who has sex with both men and women. ? Have had an STI. Follow these instructions at home:  Take over-the-counter and prescription medicines as told by your health care provider.  Keep all follow-up visits as told by your health care provider. This is important. Where to find more information  Centers for Disease Control and Prevention: PinkCheek.be  Planned Parenthood: https://www.plannedparenthood.org/  Office on [employer]: BasketballVoice.it Summary  Practicing safe sex means taking steps before and during sex to reduce your risk of STIs, giving your partner STIs, and having an unwanted or unplanned pregnancy.  Before having sex with a new partner, talk to your partner about past partners, past STIs, and drug use.  Use a condom every time you have vaginal, oral, or anal sex. Both females and males should wear condoms during oral sex.  Check your body regularly for sores, blisters, rashes, or unusual discharge. If you notice any of these problems, visit your health care provider.  See your health care provider for regular screenings, exams, and tests for STIs. This information is not intended to replace advice given to you by your health care provider. Make sure you discuss any questions you have with your health care provider. Document Revised: 09/14/2018 Document Reviewed: 03/05/2018 Elsevier Patient Education  [place].

## 2020-03-04 LAB — HEPATITIS C ANTIBODY: Hep C Virus Ab: <0.1 s/co ratio (ref 0.0–0.9)

## 2020-03-04 LAB — HIV ANTIBODY (ROUTINE TESTING W REFLEX): HIV Screen 4th Generation wRfx: NONREACTIVE

## 2020-03-04 LAB — HEPATITIS B SURFACE ANTIGEN: Hepatitis B Surface Ag: NEGATIVE

## 2020-03-04 LAB — RPR: RPR Ser Ql: NONREACTIVE

## 2020-03-05 LAB — CERVICOVAGINAL ANCILLARY ONLY
Bacterial Vaginitis (gardnerella): POSITIVE — AB
Candida Glabrata: NEGATIVE
Candida Vaginitis: NEGATIVE
Chlamydia: NEGATIVE
Comment: NEGATIVE
Comment: NEGATIVE
Comment: NEGATIVE
Comment: NEGATIVE
Comment: NEGATIVE
Comment: NORMAL
Neisseria Gonorrhea: NEGATIVE
Trichomonas: NEGATIVE

## 2020-03-10 ENCOUNTER — Telehealth: Payer: Self-pay

## 2020-03-10 DIAGNOSIS — B9689 Other specified bacterial agents as the cause of diseases classified elsewhere: Secondary | ICD-10-CM

## 2020-03-10 MED ORDER — METRONIDAZOLE 500 MG PO TABS: 500.0000 mg | ORAL_TABLET | Freq: Two times a day (BID) | ORAL | 0 refills | Status: DC

## 2020-03-10 NOTE — Telephone Encounter (Signed)
Pt called the office stating she saw her positive BV results on Mychart.Flagyl was sent to her pharmacy. Pt was advised not to drink as the medication can make her sick. Understanding was voiced.  Kaiesha Tonner l Yvonne Stopher, CMA

## 2020-03-18 ENCOUNTER — Other Ambulatory Visit: Payer: Self-pay

## 2020-03-18 ENCOUNTER — Encounter: Payer: Self-pay | Admitting: Physical Therapy

## 2020-03-18 ENCOUNTER — Ambulatory Visit: Payer: 59 | Attending: Advanced Practice Midwife | Admitting: Physical Therapy

## 2020-03-18 DIAGNOSIS — M542 Cervicalgia: Secondary | ICD-10-CM | POA: Insufficient documentation

## 2020-03-18 DIAGNOSIS — R519 Headache, unspecified: Secondary | ICD-10-CM | POA: Insufficient documentation

## 2020-03-18 DIAGNOSIS — G8929 Other chronic pain: Secondary | ICD-10-CM | POA: Diagnosis present

## 2020-03-18 DIAGNOSIS — R262 Difficulty in walking, not elsewhere classified: Secondary | ICD-10-CM | POA: Insufficient documentation

## 2020-03-18 DIAGNOSIS — M545 Low back pain, unspecified: Secondary | ICD-10-CM | POA: Insufficient documentation
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
## 2020-03-26 ENCOUNTER — Ambulatory Visit: Payer: 59 | Admitting: Physical Therapy

## 2020-04-01 ENCOUNTER — Ambulatory Visit: Payer: 59 | Admitting: Physical Therapy

## 2020-04-03 ENCOUNTER — Ambulatory Visit: Payer: 59

## 2020-04-03 ENCOUNTER — Other Ambulatory Visit: Payer: Self-pay

## 2020-04-03 ENCOUNTER — Ambulatory Visit (INDEPENDENT_AMBULATORY_CARE_PROVIDER_SITE_OTHER): Payer: 59

## 2020-04-03 ENCOUNTER — Other Ambulatory Visit (HOSPITAL_COMMUNITY)
Admission: RE | Admit: 2020-04-03 | Discharge: 2020-04-03 | Disposition: A | Discharge status: Home / Self Care | Payer: 59 | Source: Ambulatory Visit | Attending: Obstetrics & Gynecology | Admitting: Obstetrics & Gynecology

## 2020-04-03 VITALS — BP 102/74 | HR 85 | Ht 64.0 in | Wt 205.1 lb

## 2020-04-03 DIAGNOSIS — N760 Acute vaginitis: Secondary | ICD-10-CM | POA: Diagnosis present

## 2020-04-03 DIAGNOSIS — B9689 Other specified bacterial agents as the cause of diseases classified elsewhere: Secondary | ICD-10-CM | POA: Diagnosis present

## 2020-04-03 DIAGNOSIS — R102 Pelvic and perineal pain: Secondary | ICD-10-CM

## 2020-04-03 LAB — POCT URINALYSIS DIPSTICK
Bilirubin, UA: NEGATIVE
Blood, UA: NEGATIVE
Glucose, UA: NEGATIVE
Ketones, UA: NEGATIVE
Leukocytes, UA: NEGATIVE
Nitrite, UA: NEGATIVE
Protein, UA: NEGATIVE
Spec Grav, UA: 1.01 (ref 1.010–1.025)
Urobilinogen, UA: 0.2 E.U./dL
pH, UA: 7 (ref 5.0–8.0)

## 2020-04-03 LAB — POCT URINE PREGNANCY: Preg Test, Ur: NEGATIVE

## 2020-04-03 NOTE — Progress Notes (Addendum)
SUBJECTIVE:  34 y.o. female complains of clear vaginal discharge and pelvic pain for 3 week(s). Denies abnormal vaginal bleeding and fever. Denies history of known exposure to STD. Pt request UPT.  Patient's last menstrual period was 03/15/2020 (approximate).  OBJECTIVE:  She appears well, afebrile. Urine dipstick: negative for all components. Urine Pregnancy: Negative   ASSESSMENT:  Vaginal Discharge  Pelvic Pain   PLAN:  GC, chlamydia, trichomonas, BVAG, CVAG probe sent to lab. Treatment: To be determined once lab results are received ROV prn if symptoms persist or worsen.   Attestation of Attending Supervision of CMA/RN: Evaluation and management procedures were performed by the nurse under my supervision and collaboration.  I have reviewed the nursing note and chart, and I agree with the management and plan.  Carolyn L. Harraway-Smith, M.D., Cherlynn June

## 2020-04-06 LAB — CERVICOVAGINAL ANCILLARY ONLY
Bacterial Vaginitis (gardnerella): NEGATIVE
Candida Glabrata: NEGATIVE
Candida Vaginitis: NEGATIVE
Chlamydia: NEGATIVE
Comment: NEGATIVE
Comment: NEGATIVE
Comment: NEGATIVE
Comment: NEGATIVE
Comment: NEGATIVE
Comment: NORMAL
Neisseria Gonorrhea: NEGATIVE
Trichomonas: NEGATIVE

## 2020-04-08 ENCOUNTER — Ambulatory Visit: Payer: 59

## 2020-04-14 ENCOUNTER — Ambulatory Visit: Payer: 59 | Admitting: Neurology

## 2020-04-15 ENCOUNTER — Encounter: Payer: 59 | Admitting: Physical Therapy

## 2020-04-27 ENCOUNTER — Ambulatory Visit: Payer: 59 | Attending: Advanced Practice Midwife | Admitting: Physical Therapy

## 2020-04-27 ENCOUNTER — Telehealth: Payer: Self-pay

## 2020-04-27 DIAGNOSIS — B9689 Other specified bacterial agents as the cause of diseases classified elsewhere: Secondary | ICD-10-CM

## 2020-04-27 MED ORDER — METRONIDAZOLE 500 MG PO TABS: 500.0000 mg | ORAL_TABLET | Freq: Two times a day (BID) | ORAL | 0 refills | Status: DC

## 2020-04-27 NOTE — Telephone Encounter (Signed)
Patient called stating that she has bacterial vaginosis again. Patient states it started on Friday and she has an odor and discharge that is white/clear in nature. Patient denies any new sexual partners.  We discussed making sure she is wearing cotton underwear and not using any fragrance in her soaps or douching. Patient states she will switch soaps to [facility] fragrance free.   Patient states she cant come in for swab because she started new job and is on 90 days probation.  Will send in Falgyl per protocol and she is to call if she doesn't see symptoms resolve. Patient states understanding. Kathrene Alu RN

## 2020-09-01 ENCOUNTER — Ambulatory Visit: Payer: Medicaid Other | Admitting: Advanced Practice Midwife

## 2020-09-17 ENCOUNTER — Encounter: Payer: Self-pay | Admitting: Family Medicine

## 2020-09-17 ENCOUNTER — Ambulatory Visit (INDEPENDENT_AMBULATORY_CARE_PROVIDER_SITE_OTHER): Payer: 59 | Admitting: Family Medicine

## 2020-09-17 ENCOUNTER — Other Ambulatory Visit (HOSPITAL_COMMUNITY)
Admission: RE | Admit: 2020-09-17 | Discharge: 2020-09-17 | Disposition: A | Discharge status: Home / Self Care | Payer: 59 | Source: Ambulatory Visit | Attending: Family Medicine | Admitting: Family Medicine

## 2020-09-17 ENCOUNTER — Other Ambulatory Visit: Payer: Self-pay

## 2020-09-17 VITALS — BP 108/68 | HR 70 | Wt 207.0 lb

## 2020-09-17 DIAGNOSIS — R102 Pelvic and perineal pain: Secondary | ICD-10-CM

## 2020-09-17 DIAGNOSIS — Z113 Encounter for screening for infections with a predominantly sexual mode of transmission: Secondary | ICD-10-CM | POA: Insufficient documentation

## 2020-09-17 DIAGNOSIS — R350 Frequency of micturition: Secondary | ICD-10-CM

## 2020-09-17 DIAGNOSIS — R87612 Low grade squamous intraepithelial lesion on cytologic smear of cervix (LGSIL): Secondary | ICD-10-CM

## 2020-09-17 DIAGNOSIS — Z01419 Encounter for gynecological examination (general) (routine) without abnormal findings: Secondary | ICD-10-CM | POA: Insufficient documentation

## 2020-09-17 LAB — POCT URINALYSIS DIPSTICK
Bilirubin, UA: NEGATIVE
Glucose, UA: NEGATIVE
Ketones, UA: NEGATIVE
Leukocytes, UA: NEGATIVE
Nitrite, UA: NEGATIVE
Protein, UA: NEGATIVE
Spec Grav, UA: 1.01 (ref 1.010–1.025)
Urobilinogen, UA: 0.2 E.U./dL
pH, UA: 5 (ref 5.0–8.0)

## 2020-09-17 NOTE — Progress Notes (Signed)
  History:  Ms. Paula Dixon is a 35 y.o. [phone] who presents to clinic today for annual exam and pap smear. She has intermittent left lower abdominal pain that has been ongoing for several months. She reports increased urinary frequency over the last weeks. She tried PT because she thought it was muscular but this has not helped. Pain is improved after defecation and urination. She does not associate pain with cycles. She does note that her cycle duration is irregular, sometimes lasting 7 days and other times 3. She has some disruption of sleep, has difficulty falling and remaining asleep. Does not occur every night. Denies hot flashes. Denies constipation. She also reports a "different smell" of vaginal discharge. She denies dysuria, or discoloration. She would like to be tested for STD and vaginal infections. She discontinued BC because she was having migraines with vision changes. She is not interested in trying another form at this time.    The following portions of the patient's history were reviewed and updated as appropriate: allergies, current medications, family history, past medical history, social history, past surgical history and problem list.   Past Medical History:   Past Medical History:  Diagnosis Date  . Common migraine with intractable migraine 01/10/2020  . Neuromuscular disorder ([place])   . Vaginal Pap smear, abnormal     Review of Systems:  Review of Systems  All other systems reviewed and are negative.     Objective:  Physical Exam BP 108/68   Pulse 70   Wt 207 lb (93.9 kg)   BMI 35.53 kg/m  GENERAL: Well-developed, well-nourished female in no acute distress.  HEENT: Normocephalic, atraumatic. NECK: Supple. Normal thyroid.  LUNGS: Clear to auscultation bilaterally.  HEART: Regular rate and rhythm. BREASTS: Symmetric with everted nipples. No masses, skin changes, nipple drainage,   lymphadenopathy. ABDOMEN: Soft, nondistended. Mild tenderness to deep palpation  of the left lower quadrant. Some fullness. No organomegaly. PELVIC: Normal external female genitalia. Vagina is pink and rugated.  Normal discharge. Normal cervix contour. Uterus is normal in size. No adnexal mass or tenderness. Pap smear obtained.  EXTREMITIES: No edema.   Assessment & Plan:  Paula Dixon is a 35 y.o. female who presented for annual exam.   1. Annual Exam  - Pap smear: h/o abnormal pap smear in the past   2. Vaginal Odor  - STD / Wet prep  3. Birth Control - Has experienced difficulty with prior methods: IUD, nuvaring, OCPs. Does not desire Nexplanon and insurance does not cover Depo. She is not interested in another form at this time. Plans to use condoms for contraception.    4. LLQ pain: intermittent, not associated with cycle. Mild TTP. DDx: ovarian cyst, pyelonephritis, MSK, constipation - Urine dipstick normal   - Check [place] to r/u ovarian cyst   Approximately 20 minutes of total time was spent with this patient on annual visit and discussion of new and existing concerns.   Jule Economy, Medical Student 09/17/2020 9:33 AM

## 2020-09-17 NOTE — Progress Notes (Signed)
Patient presents for annual exam. Patient states that she had vaginal odor for one day and wants to be screen for std with her pap smear. Patient also states her side hurts and has been referred for PT and does stretching at home and wants to be screen for a UTI because her side still hurts. Patient states she stopped her birth control due to migraines. Kathrene Alu RN

## 2020-09-17 NOTE — Progress Notes (Signed)
GYNECOLOGY ANNUAL PREVENTATIVE CARE ENCOUNTER NOTE  Subjective:   Paula Dixon is a 35 y.o. G31P1021 female here for a routine annual gynecologic exam.  Current complaints: intermittent left lower abdominal pain ongoing for about a year. Referred to PT, but hasn't found this helpful. Pain worsened over past several days. No radiation. She reports increased urinary frequency over the last weeks. Pain is improved after defecation and urination. She does not associate pain with cycles. She has some disruption of sleep, has difficulty falling and remaining asleep. Does not occur every night. Denies hot flashes. Denies constipation. She also reports a "different smell" of vaginal discharge. She denies dysuria, or discoloration. She would like to be tested for STD and vaginal infections. She discontinued BC because she was having migraines with vision changes. She is not interested in trying another form at this time..   Denies abnormal vaginal bleeding, discharge, pelvic pain, problems with intercourse or other gynecologic concerns.    Menses about 28 day interval, 3-7 days of flow.  Gynecologic History No LMP recorded. Patient is sexually active  Contraception: condoms Last Pap: 09/2019. Results were: LSIL +HPV Last mammogram: n/a.  Obstetric History OB History  Gravida Para Term Preterm AB Living  '3 1 1   2 1  ' SAB IAB Ectopic Multiple Live Births    '1 1   1    ' # Outcome Date GA Lbr Len/2nd Weight Sex Delivery Anes PTL Lv  3 IAB 2020          2 Term 2018 [redacted]w[redacted]d  F Vag-Spont EPI N LIV  1 Ectopic 2016            Past Medical History:  Diagnosis Date  . Common migraine with intractable migraine 01/10/2020  . Neuromuscular disorder ([place]   . Vaginal Pap smear, abnormal     No past surgical history on file.  Current Outpatient Medications on File Prior to Visit  Medication Sig Dispense Refill  . benzonatate (TESSALON) 100 MG capsule Take 1 capsule (100 mg total) by mouth 3 (three)  times daily as needed for cough. (Patient not taking: Reported on 09/17/2020) 21 capsule 0  . CELLULOSE PO Take by mouth. (Patient not taking: Reported on 09/17/2020)    . COVID-19 Specimen Collection KIT See admin instructions. for testing (Patient not taking: Reported on 09/17/2020)    . drospirenone-ethinyl estradiol (YASMIN 28) 3-0.03 MG tablet Take 1 tablet by mouth daily. (Patient not taking: Reported on 09/17/2020) 28 tablet 11  . methocarbamol (ROBAXIN) 500 MG tablet Take 1 tablet (500 mg total) by mouth 2 (two) times daily. (Patient not taking: Reported on 09/17/2020) 20 tablet 0  . metroNIDAZOLE (FLAGYL) 500 MG tablet Take 1 tablet (500 mg total) by mouth 2 (two) times daily. (Patient not taking: Reported on 09/17/2020) 14 tablet 0  . Multiple Vitamin (MULTIVITAMIN) capsule Take 1 capsule by mouth daily. (Patient not taking: Reported on 09/17/2020)    . SUMAtriptan (IMITREX) 100 MG tablet One tablet up to twice a day if needed (Patient not taking: Reported on 09/17/2020) 10 tablet 3  . topiramate (TOPAMAX) 25 MG tablet Take one tablet at night for one week, then take 2 tablets at night for one week, then take 3 tablets at night. (Patient not taking: Reported on 09/17/2020) 90 tablet 3   No current facility-administered medications on file prior to visit.    Allergies  Allergen Reactions  . Fish Allergy Anaphylaxis    Social History   Socioeconomic  History  . Marital status: Single    Spouse name: Not on file  . Number of children: Not on file  . Years of education: Not on file  . Highest education level: Not on file  Occupational History  . Not on file  Tobacco Use  . Smoking status: Never Smoker  . Smokeless tobacco: Never Used  Vaping Use  . Vaping Use: Never used  Substance and Sexual Activity  . Alcohol use: Yes    Comment: occ  . Drug use: No  . Sexual activity: Yes  Other Topics Concern  . 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  Caffeine 1 day.     Social Determinants of Health   Financial Resource Strain: Not on file  Food Insecurity: Not on file  Transportation Needs: Not on file  Physical Activity: Not on file  Stress: Not on file  Social Connections: Not on file  Intimate Partner Violence: Not on file    Family History  Problem Relation Age of Onset  . Hypertension Mother   . Breast cancer Other     The following portions of the patient's history were reviewed and updated as appropriate: allergies, current medications, past family history, past medical history, past social history, past surgical history and problem list.  Review of Systems Pertinent items are noted in HPI.   Objective:  BP 108/68   Pulse 70   Wt 207 lb (93.9 kg)   BMI 35.53 kg/m  Wt Readings from Last 3 Encounters:  09/17/20 207 lb (93.9 kg)  04/03/20 205 lb 1.3 oz (93 kg)  03/03/20 206 lb (93.4 kg)     Chaperone present during exam  CONSTITUTIONAL: Well-developed, well-nourished female in no acute distress.  HENT:  Normocephalic, atraumatic, External right and left ear normal. Oropharynx is clear and moist EYES: Conjunctivae and EOM are normal. Pupils are equal, round, and reactive to light. No scleral icterus.  NECK: Normal range of motion, supple, no masses.  Normal thyroid.   CARDIOVASCULAR: Normal heart rate noted, regular rhythm RESPIRATORY: Clear to auscultation bilaterally. Effort and breath sounds normal, no problems with respiration noted. BREASTS: Symmetric in size. No masses, skin changes, nipple drainage, or lymphadenopathy. ABDOMEN: Soft, normal bowel sounds, no distention noted.  No tenderness, rebound or guarding.  PELVIC: Normal appearing external genitalia; normal appearing vaginal mucosa and cervix.  No abnormal discharge noted.  Normal uterine size, no other palpable masses, no uterine or adnexal tenderness. MUSCULOSKELETAL: Normal range of motion. No tenderness.  No cyanosis, clubbing, or edema.   2+ distal pulses. SKIN: Skin is warm and dry. No rash noted. Not diaphoretic. No erythema. No pallor. NEUROLOGIC: Alert and oriented to person, place, and time. Normal reflexes, muscle tone coordination. No cranial nerve deficit noted. PSYCHIATRIC: Normal mood and affect. Normal behavior. Normal judgment and thought content.  Assessment:  Annual gynecologic examination with pap smear   Plan:  1. Well Woman Exam Will follow up results of pap smear and manage accordingly. Mammogram scheduled STD testing discussed. Patient requested testing - Cytology - PAP( [place])  2. Urinary frequency UA normal - POCT Urinalysis Dipstick  3. Screening for STD (sexually transmitted disease) Wet prep, GC/CT - Cervicovaginal ancillary only( [place])  4. Pelvic pain in female Check [place] - US PELVIC COMPLETE WITH TRANSVAGINAL; Future  5. LSIL 1 year since last PAP. Will need to recheck PAP and treat according to results.  Routine preventative  health maintenance measures emphasized. Please refer to After Visit Summary for other counseling recommendations.    Loma Boston, [place] for [employer]

## 2020-09-18 LAB — CERVICOVAGINAL ANCILLARY ONLY
Bacterial Vaginitis (gardnerella): NEGATIVE
Candida Glabrata: NEGATIVE
Candida Vaginitis: NEGATIVE
Comment: NEGATIVE
Comment: NEGATIVE
Comment: NEGATIVE

## 2020-09-21 LAB — CYTOLOGY - PAP
Adequacy: ABSENT
Chlamydia: NEGATIVE
Comment: NEGATIVE
Comment: NORMAL
Diagnosis: NEGATIVE
Diagnosis: REACTIVE
Neisseria Gonorrhea: NEGATIVE

## 2020-09-29 ENCOUNTER — Ambulatory Visit: Payer: Medicaid Other | Admitting: Advanced Practice Midwife

## 2020-10-02 ENCOUNTER — Other Ambulatory Visit: Payer: Self-pay

## 2020-10-02 ENCOUNTER — Ambulatory Visit (HOSPITAL_BASED_OUTPATIENT_CLINIC_OR_DEPARTMENT_OTHER)
Admission: RE | Admit: 2020-10-02 | Discharge: 2020-10-02 | Disposition: A | Discharge status: Home / Self Care | Payer: 59 | Source: Ambulatory Visit | Attending: Family Medicine | Admitting: Family Medicine

## 2020-10-02 DIAGNOSIS — R102 Pelvic and perineal pain: Secondary | ICD-10-CM | POA: Diagnosis not present

## 2020-10-06 ENCOUNTER — Ambulatory Visit: Payer: Medicaid Other | Admitting: Advanced Practice Midwife

## 2020-10-06 ENCOUNTER — Telehealth: Payer: Self-pay

## 2020-10-06 NOTE — Telephone Encounter (Signed)
Pt called requesting [place] results. Pt made aware that the provider will review her [place] and f/u with her. Understanding was voiced. Envi Eagleson l Aliyha Fornes, CMA

## 2020-10-08 ENCOUNTER — Other Ambulatory Visit: Payer: Self-pay | Admitting: Family Medicine

## 2020-10-08 DIAGNOSIS — R102 Pelvic and perineal pain: Secondary | ICD-10-CM

## 2020-10-08 NOTE — Progress Notes (Signed)
Discussed [place] results with patient. No Pain improved some with stretching. Will refer to pelvic floor PT.

## 2020-11-10 ENCOUNTER — Ambulatory Visit (INDEPENDENT_AMBULATORY_CARE_PROVIDER_SITE_OTHER): Payer: 59 | Admitting: Advanced Practice Midwife

## 2020-11-10 ENCOUNTER — Other Ambulatory Visit: Payer: Self-pay

## 2020-11-10 ENCOUNTER — Encounter: Payer: Self-pay | Admitting: Advanced Practice Midwife

## 2020-11-10 ENCOUNTER — Other Ambulatory Visit: Payer: Self-pay | Admitting: Advanced Practice Midwife

## 2020-11-10 VITALS — BP 119/70 | HR 75 | Wt 205.0 lb

## 2020-11-10 DIAGNOSIS — N6315 Unspecified lump in the right breast, overlapping quadrants: Secondary | ICD-10-CM

## 2020-11-10 NOTE — Patient Instructions (Signed)
Breast Cyst  A breast cyst is a sac in the breast that is filled with fluid. They are usually noncancerous (benign) and are common among women. Breast cysts are most often in the upper, outer portion of the breast. One or more cysts may develop. They form when fluid builds up inside the breast glands. There are several types of breast cysts. Some are too small to feel, but these can be seen with imaging tests such as an X-ray of the breast (mammogram) or ultrasound. Breast cysts do not increase your risk of breast cancer. They usually disappear after you no longer have a menstrual cycle (after menopause), unless you take artificial hormones (are on hormone therapy). What are the causes? This condition may be caused by:  Blockage of tubes (ducts) in the breast glands, which leads to fluid buildup. Duct blockage may result from: ? Fibrocystic breast changes. This is a common, benign condition that occurs when women go through hormonal changes during the menstrual cycle. This is a common cause of multiple breast cysts. ? Overgrowth of breast tissue or breast glands. ? Scar tissue in the breast from previous surgery.  Changes in certain female hormones (estrogen and progesterone). The exact cause of this condition is not known. What increases the risk? You may be more likely to develop breast cysts if you have not gone through menopause. What are the signs or symptoms? Symptoms of this condition include:  Feeling one or more smooth, round, soft lumps (like grapes) in the breast that are easily movable. The lump or lumps may get bigger and more painful before your menstrual period and get smaller after your menstrual period.  Breast discomfort or pain. How is this diagnosed? This condition may be diagnosed based on:  A physical exam. A cyst can be felt by your health care provider during this exam.  Imaging tests, such as mammogram or ultrasound. Fluid may be removed from the cyst with a  needle (fine-needle aspiration) and tested to make sure the cyst is not cancerous. How is this treated? Treatment may not be needed for this condition. Your health care provider may monitor the cyst to see if it goes away on its own. If the cyst is uncomfortable or gets bigger, or if you do not like how the cyst makes your breast look, you may need treatment. Treatment may include:  Hormone therapy.  Fine-needle aspiration to drain fluid from the cyst. There is a chance of the cyst coming back (recurring) after aspiration.  Surgery to remove the cyst. Follow these instructions at home: Self-exams  Do a breast self-exam every month, or as often as directed. A breast self-exam involves: ? Comparing your breasts in the mirror. ? Looking for visible changes in your skin or nipples. ? Feeling for lumps or changes.  Having many breast cysts may make it harder to feel for new lumps. Understand how your breasts normally look and feel, and write down any changes in your breasts. Tell your health care provider about any changes.   Eating and drinking  Follow instructions from your health care provider about eating and drinking restrictions.  Drink enough fluid to keep your urine pale yellow.  Avoid caffeine.  Cut down on salt (sodium) in what you eat and drink, especially before your menstrual period. Too much sodium can cause fluid buildup, breast swelling, and discomfort. General instructions  See your health care provider regularly. ? Get a yearly physical exam. ? If you are 57-43 years old, get  a clinical breast exam every 1-3 years. After the age of 66 years, get this exam every year. ? Get mammograms as often as directed.  Take over-the-counter and prescription medicines only as told by your health care provider.  Wear a supportive bra, especially when exercising.  Keep all follow-up visits as told by your health care provider. This is important. Contact a health care provider  if:  You feel, or think you feel, a lump in your breast.  You notice that both breasts look or feel different than usual.  Your breast is still causing pain after your menstrual period is over.  You find new lumps or bumps that were not there before.  You feel lumps in your armpit. Get help right away if:  You have severe pain, tenderness, redness, or warmth in your breast.  You have fluid or blood leaking from your nipple.  Your breast lump becomes hard and painful.  You notice dimpling or wrinkling of the breast or nipple. Summary  A breast cyst is a sac in the breast that is filled with fluid.  Treatment may not be needed for this condition.  If the cyst is uncomfortable or gets bigger, or if you do not like how the cyst makes your breast look, you may need treatment. This information is not intended to replace advice given to you by your health care provider. Make sure you discuss any questions you have with your health care provider. Document Revised: 10/09/2018 Document Reviewed: 10/09/2018 Elsevier Patient Education  [place].

## 2020-11-10 NOTE — Progress Notes (Signed)
Pt feels lump in right breast

## 2020-11-10 NOTE — Progress Notes (Signed)
   Subjective:    Patient ID: Paula Dixon, female    DOB: [birth year], 35 y.o.   MRN: [id number]  HPI This is a 35 y.o. female who presents with a new lump in her right breast States it first was noted on Nov 03, 2020, started as smaller and has grown in size Has been squeezing it and putting on a cream from her country for "boils" Worried because she has breast cancer in her family ([place] and [place]).  Unsure if any were prior to age 77  Review of Systems  Constitutional: Negative for chills and fever.  Respiratory: Negative for chest tightness and shortness of breath.        Lump in right breast, tender to touch  Gastrointestinal: Negative for abdominal pain.  Neurological: Negative for dizziness and weakness.       Objective:   Physical Exam Constitutional:      General: She is not in acute distress.    Appearance: She is not ill-appearing or toxic-appearing.  HENT:     Head: Normocephalic.  Cardiovascular:     Rate and Rhythm: Normal rate.  Pulmonary:     Effort: Pulmonary effort is normal.     Comments: Right breast with soft, tender, mobile mass to right of nipple, in areolar tissue, at 9:00 position.  1cm x 1.5cm Chest:     Chest wall: Mass present.  Breasts:     Right: Tenderness present. No inverted nipple, nipple discharge, axillary adenopathy or supraclavicular adenopathy.     Left: Normal. No inverted nipple, nipple discharge, tenderness, axillary adenopathy or supraclavicular adenopathy.     Musculoskeletal:     Cervical back: Normal range of motion and neck supple. No tenderness.  Lymphadenopathy:     Cervical: No cervical adenopathy.     Upper Body:     Right upper body: No supraclavicular, axillary or pectoral adenopathy.     Left upper body: No supraclavicular, axillary or pectoral adenopathy.  Skin:    General: Skin is warm and dry.  Neurological:     General: No focal deficit present.     Mental Status: She is alert.  Psychiatric:        Mood and  Affect: Mood normal.           Assessment & Plan:  A:   Right breast mass  P:  Discussed this is likely an abscess or cyst       Ordered breast US at [facility]       Will follow PRN

## 2020-11-12 NOTE — Addendum Note (Signed)
Addended by: Cydney Ok B on: 11/12/2020 02:35 PM   Modules accepted: Orders

## 2020-12-02 ENCOUNTER — Telehealth: Payer: Self-pay

## 2020-12-02 DIAGNOSIS — N760 Acute vaginitis: Secondary | ICD-10-CM

## 2020-12-02 MED ORDER — METRONIDAZOLE 500 MG PO TABS: 500.0000 mg | ORAL_TABLET | Freq: Two times a day (BID) | ORAL | 0 refills | Status: DC

## 2020-12-02 NOTE — Telephone Encounter (Signed)
Pt called stating she feels like she has BV again. She states she is having vaginal discharge with itching and odor. Flagyl 500 mg x 7 days BID. Suhan Paci l Curtina Grills, CMA

## 2020-12-29 ENCOUNTER — Other Ambulatory Visit: Payer: Self-pay | Admitting: Advanced Practice Midwife

## 2020-12-29 DIAGNOSIS — N6310 Unspecified lump in the right breast, unspecified quadrant: Secondary | ICD-10-CM

## 2020-12-29 NOTE — Progress Notes (Signed)
Order placed for biopsy per request [facility] Radiology for right breast mass

## 2021-01-04 ENCOUNTER — Ambulatory Visit: Payer: 59 | Admitting: Physical Therapy

## 2021-02-03 ENCOUNTER — Other Ambulatory Visit: Payer: Self-pay

## 2021-02-03 ENCOUNTER — Encounter: Payer: Self-pay | Admitting: General Practice

## 2021-02-03 ENCOUNTER — Ambulatory Visit (INDEPENDENT_AMBULATORY_CARE_PROVIDER_SITE_OTHER): Payer: 59 | Admitting: Family Medicine

## 2021-02-03 ENCOUNTER — Encounter: Payer: Self-pay | Admitting: Family Medicine

## 2021-02-03 DIAGNOSIS — Z3481 Encounter for supervision of other normal pregnancy, first trimester: Secondary | ICD-10-CM

## 2021-02-03 DIAGNOSIS — Z3A08 8 weeks gestation of pregnancy: Secondary | ICD-10-CM | POA: Diagnosis not present

## 2021-02-03 DIAGNOSIS — Z3480 Encounter for supervision of other normal pregnancy, unspecified trimester: Secondary | ICD-10-CM | POA: Insufficient documentation

## 2021-02-03 NOTE — Progress Notes (Signed)
DATING AND VIABILITY SONOGRAM   Paula Dixon is a 35 y.o. year old G50P1021 with LMP Patient's last menstrual period was 12/05/2020. which would correlate to  54w4dweeks gestation.  She has regular menstrual cycles.   She is here today for a confirmatory initial sonogram.    GESTATION: SINGLETON     FETAL ACTIVITY:          Heart rate         173 bpm          The fetus is active.     GESTATIONAL AGE AND  BIOMETRICS:  Gestational criteria: Estimated Date of Delivery: 09/11/21 by LMP now at 851w4dPrevious Scans:0  GESTATIONAL SAC           2.21 cm         7-1 weeks  CROWN RUMP LENGTH            cm         8-2 weeks                                                                               AVERAGE EGA(BY THIS SCAN):  8-2 weeks  WORKING EDD( LMP ):  09/11/21     TECHNICIAN COMMENTS:  Patient informed that the ultrasound is considered a limited obstetric ultrasound and is not intended to be a complete ultrasound exam.  Patient also informed that the ultrasound is not being completed with the intent of assessing for fetal or placental anomalies or any pelvic abnormalities. Explained that the purpose of today's ultrasound is to assess for fetal heart rate.  Patient acknowledges the purpose of the exam and the limitations of the study.     JeKathrene Alu/31/2022 9:38 AM

## 2021-02-03 NOTE — Progress Notes (Signed)
   Subjective:    Patient ID: Paula Dixon, female    DOB: 1985/07/19, 35 y.o.   MRN: [id number]  HPI Patient here for positive pregnancy test. She is uncertain of her dates. Has heartburn and some nausea, but not bad. This is an unwanted pregnancy, but feels really conflicted regarding termination.    Review of Systems     Objective:   Physical Exam Vitals and nursing note reviewed.  Constitutional:      Appearance: Normal appearance.  Skin:    General: Skin is warm.     Capillary Refill: Capillary refill takes less than 2 seconds.  Neurological:     General: No focal deficit present.     Mental Status: She is alert.  Psychiatric:        Mood and Affect: Mood normal.        Behavior: Behavior normal.        Thought Content: Thought content normal.        Judgment: Judgment normal.      Assessment & Plan:  1. Supervision of other normal pregnancy, antepartum [place] today shows [redacted]w[redacted]d consistent with LMP.  I discussed options with her - medical vs surgical termination - as well as continuing pregnancy care with support. Discussed 72 wait period, which may make her fall out of the medical abortion window.  Has limited support with FOB. F/u in 4 weeks

## 2021-02-06 ENCOUNTER — Other Ambulatory Visit: Payer: Self-pay

## 2021-02-06 DIAGNOSIS — B9689 Other specified bacterial agents as the cause of diseases classified elsewhere: Secondary | ICD-10-CM

## 2021-02-06 DIAGNOSIS — N760 Acute vaginitis: Secondary | ICD-10-CM

## 2021-02-08 ENCOUNTER — Other Ambulatory Visit: Payer: Self-pay

## 2021-02-08 DIAGNOSIS — B9689 Other specified bacterial agents as the cause of diseases classified elsewhere: Secondary | ICD-10-CM

## 2021-02-08 DIAGNOSIS — N760 Acute vaginitis: Secondary | ICD-10-CM

## 2021-02-09 ENCOUNTER — Ambulatory Visit: Payer: 59 | Admitting: Physical Therapy

## 2021-02-09 MED ORDER — METRONIDAZOLE 500 MG PO TABS: 500.0000 mg | ORAL_TABLET | Freq: Two times a day (BID) | ORAL | 0 refills | Status: DC

## 2021-03-05 ENCOUNTER — Ambulatory Visit: Payer: 59 | Admitting: Family Medicine

## 2021-03-29 ENCOUNTER — Ambulatory Visit: Payer: 59 | Attending: Family Medicine | Admitting: Physical Therapy

## 2021-04-06 ENCOUNTER — Other Ambulatory Visit: Payer: Self-pay

## 2021-04-06 ENCOUNTER — Encounter: Payer: Self-pay | Admitting: Emergency Medicine

## 2021-04-06 ENCOUNTER — Emergency Department (INDEPENDENT_AMBULATORY_CARE_PROVIDER_SITE_OTHER)
Admission: EM | Admit: 2021-04-06 | Discharge: 2021-04-06 | Disposition: A | Discharge status: Home / Self Care | Payer: 59 | Source: Home / Self Care

## 2021-04-06 DIAGNOSIS — J029 Acute pharyngitis, unspecified: Secondary | ICD-10-CM | POA: Diagnosis not present

## 2021-04-06 DIAGNOSIS — R509 Fever, unspecified: Secondary | ICD-10-CM

## 2021-04-06 DIAGNOSIS — R059 Cough, unspecified: Secondary | ICD-10-CM | POA: Diagnosis not present

## 2021-04-06 LAB — POCT RAPID STREP A (OFFICE): Rapid Strep A Screen: NEGATIVE

## 2021-04-06 MED ORDER — ACETAMINOPHEN 500 MG PO TABS: 1000.0000 mg | ORAL_TABLET | Freq: Once | ORAL | Status: DC

## 2021-04-06 MED ORDER — ACETAMINOPHEN 500 MG PO TABS
1000.0000 mg | ORAL_TABLET | Freq: Once | ORAL | Status: AC
  Administered 2021-04-06: 1000 mg via ORAL

## 2021-04-06 MED ORDER — PREDNISONE 20 MG PO TABS: ORAL_TABLET | ORAL | 0 refills | Status: DC

## 2021-04-06 MED ORDER — BENZONATATE 200 MG PO CAPS: 200.0000 mg | ORAL_CAPSULE | Freq: Three times a day (TID) | ORAL | 0 refills | Status: AC | PRN

## 2021-04-06 NOTE — ED Triage Notes (Addendum)
Cough & sore throat since Friday  Tongue has been sore since yesterday  Daughter was in the hospital recently for respiratory issus approx 10 days ago - Neg  for COVID, flu & RSV per pt  Denies fever Flu vaccine this year  Took tylenol  1 hour pta

## 2021-04-06 NOTE — Discharge Instructions (Addendum)
Advised/instructed patient to take medication as directed with food to completion. Encouraged patient increase daily water intake while taking this medication advised patient may take Tessalon Perles daily for cough or as needed.  Advised patient we will follow-up with throat culture, COVID-19, flu A/B results once received.

## 2021-04-06 NOTE — ED Notes (Signed)
STREP REORDERED TO SEND TO [facility] INSTEAD

## 2021-04-06 NOTE — ED Provider Notes (Signed)
Report [facility]    CSN: [id number] Arrival date & time: 04/06/21  1024      History   Chief Complaint Chief Complaint  Patient presents with   Sore Throat   Cough    HPI Paula Dixon is a 35 y.o. female.   HPI 35 year old female presents with cough and sore throat for 2 days daughter was recently hospitalized for respiratory issues 10 days ago.  Past Medical History:  Diagnosis Date   Common migraine with intractable migraine 01/10/2020   Neuromuscular disorder ([place])    Vaginal Pap smear, abnormal     Patient Active Problem List   Diagnosis Date Noted   Supervision of other normal pregnancy, antepartum 02/03/2021   Breast mass, right 12/29/2020   Low back pain 03/03/2020   Oral contraceptive prescribed 03/03/2020   Depression 03/03/2020   Common migraine with intractable migraine 01/10/2020    History reviewed. No pertinent surgical history.  OB History     Gravida  4   Para  1   Term  1   Preterm      AB  2   Living  1      SAB      IAB  1   Ectopic  1   Multiple      Live Births  1            Home Medications    Prior to Admission medications   Medication Sig Start Date End Date Taking? Authorizing Provider  benzonatate (TESSALON) 200 MG capsule Take 1 capsule (200 mg total) by mouth 3 (three) times daily as needed for up to 7 days for cough. 04/06/21 04/13/21 Yes Eliezer Lofts, FNP  predniSONE (DELTASONE) 20 MG tablet Take 3 tabs PO daily x 5 days. 04/06/21  Yes Eliezer Lofts, FNP  metroNIDAZOLE (FLAGYL) 500 MG tablet Take 1 tablet (500 mg total) by mouth 2 (two) times daily. Patient not taking: Reported on 04/06/2021 02/09/21   Truett Mainland, DO  Multiple Vitamin (MULTIVITAMIN) capsule Take 1 capsule by mouth daily. Patient not taking: No sig reported    [provider]    Family History Family History  Problem Relation Age of Onset   Hypertension Mother    Breast cancer Other     Social  History Social History   Tobacco Use   Smoking status: Never   Smokeless tobacco: Never  Vaping Use   Vaping Use: Never used  Substance Use Topics   Alcohol use: Yes    Comment: occ   Drug use: No     Allergies   Fish allergy   Review of Systems Review of Systems  Constitutional:  Positive for fever.  HENT:  Positive for sore throat.   Respiratory:  Positive for cough.   All other systems reviewed and are negative.   Physical Exam Triage Vital Signs ED Triage Vitals  Enc Vitals Group     BP 04/06/21 1040 113/81     Pulse Rate 04/06/21 1040 68     Resp 04/06/21 1040 16     Temp 04/06/21 1040 (!) 100.4 F (38 C)     Temp Source 04/06/21 1040 Oral     SpO2 04/06/21 1040 98 %     Weight 04/06/21 1042 215 lb (97.5 kg)     Height 04/06/21 1042 5\' 4"  (1.626 m)     Head Circumference --      Peak Flow --      Pain Score  04/06/21 1041 9     Pain Loc --      Pain Edu? --      Excl. in [place]? --    No data found.  Updated Vital Signs BP 113/81 (BP Location: Right Arm)   Pulse 68   Temp (!) 100.4 F (38 C) (Oral)   Resp 16   Ht 5\' 4"  (1.626 m)   Wt 215 lb (97.5 kg)   LMP 03/30/2021 (Exact Date)   SpO2 98%   Breastfeeding No   BMI 36.90 kg/m    Physical Exam Vitals and nursing note reviewed.  Constitutional:      Appearance: She is obese.  HENT:     Head: Normocephalic and atraumatic.     Right Ear: Tympanic membrane and ear canal normal.     Left Ear: Tympanic membrane and ear canal normal.     Mouth/Throat:     Mouth: Mucous membranes are moist.     Pharynx: Oropharynx is clear. Uvula midline.  Eyes:     Conjunctiva/sclera: Conjunctivae normal.     Pupils: Pupils are equal, round, and reactive to light.  Cardiovascular:     Rate and Rhythm: Normal rate and regular rhythm.     Heart sounds: Normal heart sounds.  Pulmonary:     Effort: Pulmonary effort is normal.     Breath sounds: Normal breath sounds.  Musculoskeletal:     Cervical back: Normal  range of motion and neck supple.  Skin:    General: Skin is warm and dry.  Neurological:     General: No focal deficit present.     Mental Status: She is alert and oriented to person, place, and time.     UC Treatments / Results  Labs (all labs ordered are listed, but only abnormal results are displayed) Labs Reviewed  STREP A DNA PROBE  COVID-19, FLU A+B NAA  CULTURE, GROUP A STREP  POCT RAPID STREP A (OFFICE)    EKG   Radiology No results found.  Procedures Procedures (including critical care time)  Medications Ordered in UC Medications  acetaminophen (TYLENOL) tablet 1,000 mg (has no administration in time range)  acetaminophen (TYLENOL) tablet 1,000 mg (1,000 mg Oral Given 04/06/21 1134)    Initial Impression / Assessment and Plan / UC Course  I have reviewed the triage vital signs and the nursing notes.  Pertinent labs & imaging results that were available during my care of the patient were reviewed by me and considered in my medical decision making (see chart for details).     MDM: 1.  Acute pharyngitis-rapid strep negative, throat culture ordered; 2.  Fever-Tylenol 1000 mg given once in clinic prior to discharge today; 3.  Cough-Rx'd Tessalon Perles. Advised/instructed patient to take medication as directed with food to completion. Encouraged patient increase daily water intake while taking this medication advised patient may take Tessalon Perles daily for cough or as needed.  Advised patient we will follow-up with throat culture, COVID-19, flu A/B results once received.  Discharged home, hemodynamically stable. Final Clinical Impressions(s) / UC Diagnoses   Final diagnoses:  Acute pharyngitis, unspecified etiology  Fever, unspecified  Cough, unspecified type     Discharge Instructions      Advised/instructed patient to take medication as directed with food to completion. Encouraged patient increase daily water intake while taking this medication advised  patient may take Tessalon Perles daily for cough or as needed.  Advised patient we will follow-up with throat culture, COVID-19, flu A/B  results once received.     ED Prescriptions     Medication Sig Dispense Auth. Provider   benzonatate (TESSALON) 200 MG capsule Take 1 capsule (200 mg total) by mouth 3 (three) times daily as needed for up to 7 days for cough. 40 capsule Eliezer Lofts, FNP   predniSONE (DELTASONE) 20 MG tablet Take 3 tabs PO daily x 5 days. 15 tablet Eliezer Lofts, FNP      PDMP not reviewed this encounter.   Eliezer Lofts, [place] 04/06/21 1140

## 2021-04-08 LAB — COVID-19, FLU A+B NAA
Influenza A, NAA: NOT DETECTED
Influenza B, NAA: NOT DETECTED
SARS-CoV-2, NAA: NOT DETECTED

## 2021-04-10 LAB — CULTURE, GROUP A STREP: Strep A Culture: NEGATIVE

## 2021-04-14 ENCOUNTER — Other Ambulatory Visit: Payer: Self-pay

## 2021-04-14 ENCOUNTER — Other Ambulatory Visit (HOSPITAL_COMMUNITY)
Admission: RE | Admit: 2021-04-14 | Discharge: 2021-04-14 | Disposition: A | Discharge status: Home / Self Care | Payer: 59 | Source: Ambulatory Visit | Attending: Family Medicine | Admitting: Family Medicine

## 2021-04-14 ENCOUNTER — Encounter: Payer: Self-pay | Admitting: Family Medicine

## 2021-04-14 ENCOUNTER — Ambulatory Visit (INDEPENDENT_AMBULATORY_CARE_PROVIDER_SITE_OTHER): Payer: 59 | Admitting: Family Medicine

## 2021-04-14 VITALS — BP 110/71 | HR 72 | Wt 213.0 lb

## 2021-04-14 DIAGNOSIS — Z7721 Contact with and (suspected) exposure to potentially hazardous body fluids: Secondary | ICD-10-CM

## 2021-04-14 DIAGNOSIS — N760 Acute vaginitis: Secondary | ICD-10-CM

## 2021-04-14 DIAGNOSIS — Z30011 Encounter for initial prescription of contraceptive pills: Secondary | ICD-10-CM

## 2021-04-14 DIAGNOSIS — B9689 Other specified bacterial agents as the cause of diseases classified elsewhere: Secondary | ICD-10-CM

## 2021-04-14 MED ORDER — NORETHIN ACE-ETH ESTRAD-FE 1-20 MG-MCG(24) PO TABS: 1.0000 | ORAL_TABLET | Freq: Every day | ORAL | 11 refills | Status: DC

## 2021-04-14 MED ORDER — BORIC ACID CRYS: 600.0000 mg | CRYSTALS | Freq: Every day | 5 refills | Status: DC

## 2021-04-14 NOTE — Progress Notes (Signed)
   Subjective:    Patient ID: Paula Dixon, female    DOB: May 16, 1986, 35 y.o.   MRN: [id number]  HPI Patient seen for contraception. Has been on OCPs and POP in the past and had depression, anxiety, headaches. The OCPs she's been on were on lower estrogen containing pills. Does not want nexplanon or IUD. Would be okay with depo provera.  Would like STD screen - recently ended relationship and is concerned that there was cheating.   Review of Systems     Objective:   Physical Exam Vitals reviewed. Exam conducted with a chaperone present.  Constitutional:      Appearance: Normal appearance.  Abdominal:     General: Abdomen is flat.     Palpations: Abdomen is soft.     Hernia: There is no hernia in the left inguinal area or right inguinal area.  Genitourinary:    Labia:        Right: No rash, tenderness or lesion.        Left: No rash, tenderness or lesion.      Vagina: No signs of injury. No vaginal discharge.     Cervix: No cervical motion tenderness, friability or erythema.  Lymphadenopathy:     Lower Body: No right inguinal adenopathy. No left inguinal adenopathy.  Skin:    Capillary Refill: Capillary refill takes less than 2 seconds.  Neurological:     General: No focal deficit present.     Mental Status: She is alert and oriented to person, place, and time.  Psychiatric:        Mood and Affect: Mood normal.        Behavior: Behavior normal.        Thought Content: Thought content normal.        Judgment: Judgment normal.       Assessment & Plan:  1. Exposure to body fluid STD screen today with vaginal testing. Does not wish to have serum testing done. - Cervicovaginal ancillary only( )  2. Encounter for initial prescription of contraceptive pills Discussed options - Loestrin prescribed. If not tolerating, possibly switch different progesterone, but still low dose estrogen.

## 2021-04-14 NOTE — Progress Notes (Signed)
Patient presents for follow up of TAB, STD testing and discuss birth control. Kathrene Alu RN

## 2021-04-15 LAB — CERVICOVAGINAL ANCILLARY ONLY
Bacterial Vaginitis (gardnerella): POSITIVE — AB
Candida Glabrata: NEGATIVE
Candida Vaginitis: NEGATIVE
Chlamydia: NEGATIVE
Comment: NEGATIVE
Comment: NEGATIVE
Comment: NEGATIVE
Comment: NEGATIVE
Comment: NEGATIVE
Comment: NORMAL
Neisseria Gonorrhea: NEGATIVE
Trichomonas: NEGATIVE

## 2021-04-16 MED ORDER — METRONIDAZOLE 500 MG PO TABS: 500.0000 mg | ORAL_TABLET | Freq: Two times a day (BID) | ORAL | 0 refills | Status: DC

## 2021-04-16 NOTE — Addendum Note (Signed)
Addended by: Truett Mainland on: 04/16/2021 08:27 AM   Modules accepted: Orders

## 2021-04-19 ENCOUNTER — Emergency Department (INDEPENDENT_AMBULATORY_CARE_PROVIDER_SITE_OTHER)
Admission: EM | Admit: 2021-04-19 | Discharge: 2021-04-19 | Disposition: A | Discharge status: Home / Self Care | Payer: 59 | Source: Home / Self Care

## 2021-04-19 ENCOUNTER — Other Ambulatory Visit: Payer: Self-pay

## 2021-04-19 DIAGNOSIS — J029 Acute pharyngitis, unspecified: Secondary | ICD-10-CM

## 2021-04-19 LAB — POCT RAPID STREP A (OFFICE): Rapid Strep A Screen: NEGATIVE

## 2021-04-19 MED ORDER — CEFDINIR 300 MG PO CAPS: 300.0000 mg | ORAL_CAPSULE | Freq: Two times a day (BID) | ORAL | 0 refills | Status: AC

## 2021-04-19 NOTE — ED Provider Notes (Signed)
[facility]    CSN: [id number] Arrival date & time: 04/19/21  1810      History   Chief Complaint Chief Complaint  Patient presents with   Sore Throat    HPI Paula Dixon is a 35 y.o. female.   HPI 35 year old female presents with continued sore throat since 04/02/21.  Patient was evaluated by me on 04/06/21 rapid strep was negative as well as throat culture.  Past Medical History:  Diagnosis Date   Common migraine with intractable migraine 01/10/2020   Neuromuscular disorder ([place])    Vaginal Pap smear, abnormal     Patient Active Problem List   Diagnosis Date Noted   Breast mass, right 12/29/2020   Low back pain 03/03/2020   Oral contraceptive prescribed 03/03/2020   Depression 03/03/2020   Common migraine with intractable migraine 01/10/2020    History reviewed. No pertinent surgical history.  OB History     Gravida  4   Para  1   Term  1   Preterm      AB  2   Living  1      SAB      IAB  1   Ectopic  1   Multiple      Live Births  1            Home Medications    Prior to Admission medications   Medication Sig Start Date End Date Taking? Authorizing Provider  cefdinir (OMNICEF) 300 MG capsule Take 1 capsule (300 mg total) by mouth 2 (two) times daily for 7 days. 04/19/21 04/26/21 Yes Eliezer Lofts, FNP  Boric Acid [facility] 600 mg vaginally at bedtime. Use vaginally every night for two weeks then twice a week 04/14/21   Truett Mainland, DO  metroNIDAZOLE (FLAGYL) 500 MG tablet Take 1 tablet (500 mg total) by mouth 2 (two) times daily. 04/16/21   Truett Mainland, DO  Multiple Vitamin (MULTIVITAMIN) capsule Take 1 capsule by mouth daily. Patient not taking: No sig reported    [provider]  Norethindrone Acetate-Ethinyl Estrad-FE (LOESTRIN 24 FE) 1-20 MG-MCG(24) tablet Take 1 tablet by mouth daily. 04/14/21   Truett Mainland, DO  predniSONE (DELTASONE) 20 MG tablet Take 3 tabs PO daily x 5 days. Patient not  taking: Reported on 04/14/2021 04/06/21   Eliezer Lofts, FNP    Family History Family History  Problem Relation Age of Onset   Hypertension Mother    Breast cancer Other     Social History Social History   Tobacco Use   Smoking status: Never   Smokeless tobacco: Never  Vaping Use   Vaping Use: Never used  Substance Use Topics   Alcohol use: Yes    Comment: occ   Drug use: No     Allergies   Fish allergy   Review of Systems Review of Systems  HENT:  Positive for sore throat.   All other systems reviewed and are negative.   Physical Exam Triage Vital Signs ED Triage Vitals  Enc Vitals Group     BP 04/19/21 1835 114/79     Pulse Rate 04/19/21 1835 82     Resp 04/19/21 1835 16     Temp 04/19/21 1835 98.9 F (37.2 C)     Temp Source 04/19/21 1835 Oral     SpO2 04/19/21 1835 100 %     Weight --      Height --      Head Circumference --  Peak Flow --      Pain Score 04/19/21 1836 10     Pain Loc --      Pain Edu? --      Excl. in [place]? --    No data found.  Updated Vital Signs BP 114/79 (BP Location: Right Arm)   Pulse 82   Temp 98.9 F (37.2 C) (Oral)   Resp 16   LMP 03/30/2021 (Exact Date)   SpO2 100%   Physical Exam Vitals and nursing note reviewed.  Constitutional:      Appearance: She is well-developed. She is obese.  HENT:     Head: Normocephalic and atraumatic.     Right Ear: Tympanic membrane and ear canal normal.     Left Ear: Tympanic membrane and ear canal normal.     Mouth/Throat:     Mouth: Mucous membranes are moist.     Pharynx: Oropharynx is clear. Uvula midline. Posterior oropharyngeal erythema present. No pharyngeal swelling or uvula swelling.  Eyes:     Conjunctiva/sclera: Conjunctivae normal.     Pupils: Pupils are equal, round, and reactive to light.  Cardiovascular:     Rate and Rhythm: Normal rate and regular rhythm.     Heart sounds: Normal heart sounds.  Pulmonary:     Effort: Pulmonary effort is normal.      Breath sounds: Normal breath sounds.  Musculoskeletal:     Cervical back: Normal range of motion and neck supple.  Skin:    General: Skin is warm and dry.  Neurological:     General: No focal deficit present.     Mental Status: She is alert and oriented to person, place, and time.     UC Treatments / Results  Labs (all labs ordered are listed, but only abnormal results are displayed) Labs Reviewed  POCT RAPID STREP A (OFFICE)    EKG   Radiology No results found.  Procedures Procedures (including critical care time)  Medications Ordered in UC Medications - No data to display  Initial Impression / Assessment and Plan / UC Course  I have reviewed the triage vital signs and the nursing notes.  Pertinent labs & imaging results that were available during my care of the patient were reviewed by me and considered in my medical decision making (see chart for details).     MDM: 1.  Acute pharyngitis-rapid strep negative, Rx'd Cefdinir. Advised/instructed patient to take medication as directed with food to completion.  Patient discharged home, hemodynamically stable. Final Clinical Impressions(s) / UC Diagnoses   Final diagnoses:  Pharyngitis, unspecified etiology     Discharge Instructions      Advised/instructed patient to take medication as directed with food to completion.     ED Prescriptions     Medication Sig Dispense Auth. Provider   cefdinir (OMNICEF) 300 MG capsule Take 1 capsule (300 mg total) by mouth 2 (two) times daily for 7 days. 14 capsule Eliezer Lofts, FNP      PDMP not reviewed this encounter.   Eliezer Lofts, [facility] 04/19/21 1926

## 2021-04-19 NOTE — Discharge Instructions (Addendum)
Advised/instructed patient to take medication as directed with food to completion.

## 2021-04-19 NOTE — ED Triage Notes (Addendum)
Pt c/o continued sore throat since 10/28. Was seen in UC on 11/1. Rx'd prednisone, finished course. All other sxs resolved except sore throat. Denies fever. Pain 10/10 Currently on metronidazole for BV.

## 2021-04-27 ENCOUNTER — Emergency Department (HOSPITAL_BASED_OUTPATIENT_CLINIC_OR_DEPARTMENT_OTHER): Payer: 59

## 2021-04-27 ENCOUNTER — Encounter (HOSPITAL_BASED_OUTPATIENT_CLINIC_OR_DEPARTMENT_OTHER): Payer: Self-pay | Admitting: Emergency Medicine

## 2021-04-27 ENCOUNTER — Other Ambulatory Visit: Payer: Self-pay

## 2021-04-27 ENCOUNTER — Emergency Department (HOSPITAL_BASED_OUTPATIENT_CLINIC_OR_DEPARTMENT_OTHER)
Admission: EM | Admit: 2021-04-27 | Discharge: 2021-04-27 | Disposition: A | Discharge status: Home / Self Care | Payer: 59 | Attending: Emergency Medicine | Admitting: Emergency Medicine

## 2021-04-27 DIAGNOSIS — Z20822 Contact with and (suspected) exposure to covid-19: Secondary | ICD-10-CM | POA: Diagnosis not present

## 2021-04-27 DIAGNOSIS — B349 Viral infection, unspecified: Secondary | ICD-10-CM | POA: Diagnosis not present

## 2021-04-27 DIAGNOSIS — R059 Cough, unspecified: Secondary | ICD-10-CM | POA: Diagnosis present

## 2021-04-27 LAB — RESP PANEL BY RT-PCR (FLU A&B, COVID) ARPGX2
Influenza A by PCR: NEGATIVE
Influenza B by PCR: NEGATIVE
SARS Coronavirus 2 by RT PCR: NEGATIVE

## 2021-04-27 MED ORDER — ACETAMINOPHEN 325 MG PO TABS
650.0000 mg | ORAL_TABLET | Freq: Once | ORAL | Status: AC
  Administered 2021-04-27: 650 mg via ORAL
  Filled 2021-04-27: qty 2

## 2021-04-27 NOTE — Discharge Instructions (Signed)
Your chest x-ray shows no evidence of pneumonia.  Your COVID and flu test are pending.  You can follow these results on the online portal.  This is likely a viral illness.  There is no indication for antibiotics.  Continue Motrin and Tylenol at home for body aches and fever.  Drink plenty of fluids to hydrate.  Make sure you follow-up with your primary care doctor.  Return to the ER with worsening symptoms

## 2021-04-27 NOTE — ED Triage Notes (Signed)
Pt woke with HA and body aches today; has just completed abx "for my throat"; sts she is SHOB; no resp distress noted, O2 sats 99%, speaking in complete sentences

## 2021-04-27 NOTE — ED Provider Notes (Signed)
[facility] Provider Note   CSN: [id number] Arrival date & time: 04/27/21  1035     History Chief Complaint  Patient presents with   Generalized Body Aches    Paula Dixon is a 35 y.o. female.  HPI 35 year old female with no pertinent past medical history presents to the emergency department today for evaluation of generalized body aches, cough, shortness of breath, sore throat.  Symptoms began this morning.  Did take Aleve for her symptoms.  Patient states the cough is not productive.  No rhinorrhea or nasal congestion.  No documented fevers.  Patient reports that she finished antibiotics yesterday for a sore throat that she developed 1 week ago.  Has no known sick contacts.  Patient denies history of PE/DVT, prolonged immobilization, recent hospitalization/surgeries, unilateral leg swelling or calf tenderness, hemoptysis.  No oral contraceptive use    Past Medical History:  Diagnosis Date   Common migraine with intractable migraine 01/10/2020   Neuromuscular disorder ([place])    Vaginal Pap smear, abnormal     Patient Active Problem List   Diagnosis Date Noted   Breast mass, right 12/29/2020   Low back pain 03/03/2020   Oral contraceptive prescribed 03/03/2020   Depression 03/03/2020   Common migraine with intractable migraine 01/10/2020    History reviewed. No pertinent surgical history.   OB History     Gravida  4   Para  1   Term  1   Preterm      AB  2   Living  1      SAB      IAB  1   Ectopic  1   Multiple      Live Births  1           Family History  Problem Relation Age of Onset   Hypertension Mother    Breast cancer Other     Social History   Tobacco Use   Smoking status: Never   Smokeless tobacco: Never  Vaping Use   Vaping Use: Never used  Substance Use Topics   Alcohol use: Yes    Comment: occ   Drug use: No    Home Medications Prior to Admission medications   Medication Sig Start Date  End Date Taking? Authorizing Provider  Boric Acid [address] 600 mg vaginally at bedtime. Use vaginally every night for two weeks then twice a week 04/14/21   Truett Mainland, DO  metroNIDAZOLE (FLAGYL) 500 MG tablet Take 1 tablet (500 mg total) by mouth 2 (two) times daily. 04/16/21   Truett Mainland, DO  Multiple Vitamin (MULTIVITAMIN) capsule Take 1 capsule by mouth daily. Patient not taking: No sig reported    [provider]  Norethindrone Acetate-Ethinyl Estrad-FE (LOESTRIN 24 FE) 1-20 MG-MCG(24) tablet Take 1 tablet by mouth daily. 04/14/21   Truett Mainland, DO  predniSONE (DELTASONE) 20 MG tablet Take 3 tabs PO daily x 5 days. Patient not taking: Reported on 04/14/2021 04/06/21   Eliezer Lofts, FNP    Allergies    Fish allergy  Review of Systems   Review of Systems  Constitutional:  Positive for chills. Negative for fever.  HENT:  Positive for sore throat. Negative for congestion and rhinorrhea.   Eyes:  Negative for discharge.  Respiratory:  Positive for cough and shortness of breath.   Cardiovascular:  Negative for palpitations and leg swelling.  Gastrointestinal:  Negative for diarrhea, nausea and vomiting.  Musculoskeletal:  Positive for arthralgias  and myalgias.  Skin:  Negative for color change.  Neurological:  Positive for headaches.  Psychiatric/Behavioral:  Negative for confusion.    Physical Exam Updated Vital Signs BP 102/76   Pulse 92   Temp 98.5 F (36.9 C) (Oral)   Resp 16   Ht 5\' 4"  (1.626 m)   Wt 99.8 kg   LMP 03/30/2021 (Exact Date)   SpO2 98%   BMI 37.76 kg/m   Physical Exam Vitals and nursing note reviewed.  Constitutional:      General: She is not in acute distress.    Appearance: She is well-developed. She is not ill-appearing or toxic-appearing.  HENT:     Head: Normocephalic and atraumatic.     Right Ear: Tympanic membrane, ear canal and external ear normal.     Left Ear: Tympanic membrane, ear canal and external ear normal.      Nose: Congestion present. No rhinorrhea.     Mouth/Throat:     Mouth: Mucous membranes are moist.     Pharynx: Oropharynx is clear. No oropharyngeal exudate or posterior oropharyngeal erythema.  Eyes:     General: No scleral icterus.       Right eye: No discharge.        Left eye: No discharge.  Cardiovascular:     Rate and Rhythm: Normal rate and regular rhythm.     Heart sounds: Normal heart sounds.  Pulmonary:     Effort: Pulmonary effort is normal. No respiratory distress.     Breath sounds: Normal breath sounds.  Musculoskeletal:        General: Normal range of motion.     Cervical back: Normal range of motion and neck supple. No rigidity.  Skin:    General: Skin is warm and dry.     Capillary Refill: Capillary refill takes less than 2 seconds.     Coloration: Skin is not pale.  Neurological:     Mental Status: She is alert.  Psychiatric:        Mood and Affect: Mood normal.        Behavior: Behavior normal.        Thought Content: Thought content normal.        Judgment: Judgment normal.    ED Results / Procedures / Treatments   Labs (all labs ordered are listed, but only abnormal results are displayed) Labs Reviewed - No data to display  EKG None  Radiology DG Chest Portable 1 View  Result Date: 04/27/2021 CLINICAL DATA:  Awoke with headaches and body aches today, shortness of breath EXAM: PORTABLE CHEST 1 VIEW COMPARISON:  Portable exam 1116 hours compared to 11/18/2019 FINDINGS: Normal heart size, mediastinal contours, and pulmonary vascularity. Lungs clear. No pulmonary infiltrate, pleural effusion, or pneumothorax. Osseous structures unremarkable. Surgical clips RIGHT breast. IMPRESSION: No acute abnormalities. Electronically Signed   By: Lavonia Dana M.D.   On: 04/27/2021 11:42    Procedures Procedures   Medications Ordered in ED Medications  acetaminophen (TYLENOL) tablet 650 mg (has no administration in time range)    ED Course  I have reviewed the  triage vital signs and the nursing notes.  Pertinent labs & imaging results that were available during my care of the patient were reviewed by me and considered in my medical decision making (see chart for details).    MDM Rules/Calculators/A&P  Patient presents for what appears to be a viral-like illness.  Patient's vital signs are reassuring.  She reports some shortness of breath and pleuritic chest pain.  Patient is PERC negative.  Low suspicion for PE.  EKG was obtained that shows normal sinus rhythm without any evidence of ischemia.  Low suspicion for ACS, myocarditis, pericarditis, endocarditis, dissection.  Chest x-ray was reviewed that shows no evidence of pneumonia.  COVID and flu tests are pending.  There is no indication for antibiotics.  I suspect this is likely something that is viral.  Patient recently finished antibiotics yesterday for a sore throat.  Patient encouraged to continue symptomatic management at home including Motrin and Tylenol.  Encourage plenty of p.o. fluids.  Discussed reasons to return to the ER.  Encourage primary care follow-up.  Pt is hemodynamically stable, in NAD, & able to ambulate in the ED. Evaluation does not show pathology that would require ongoing emergent intervention or inpatient treatment. I explained the diagnosis to the patient. Pain has been managed & has no complaints prior to dc. Pt is comfortable with above plan and is stable for discharge at this time. All questions were answered prior to disposition. Strict return precautions for f/u to the ED were discussed. Encouraged follow up with PCP.  Final Clinical Impression(s) / ED Diagnoses Final diagnoses:  Viral illness    Rx / DC Orders ED Discharge Orders     None        Aaron Edelman 04/27/21 1147    Varney Biles, MD 04/28/21 [phone]

## 2021-06-02 IMAGING — US US PELVIS COMPLETE WITH TRANSVAGINAL
1 series · 13 of 25 positions shown · non-contrast
Comparison: Prior CT from 01/24/2005.

CLINICAL DATA: Initial evaluation for acute left lower
quadrant/left pelvic pain for 2-3 weeks.



[Series 1: us pelvis complete with transvaginal · 13 of 72 slices shown]
[im 1/72]
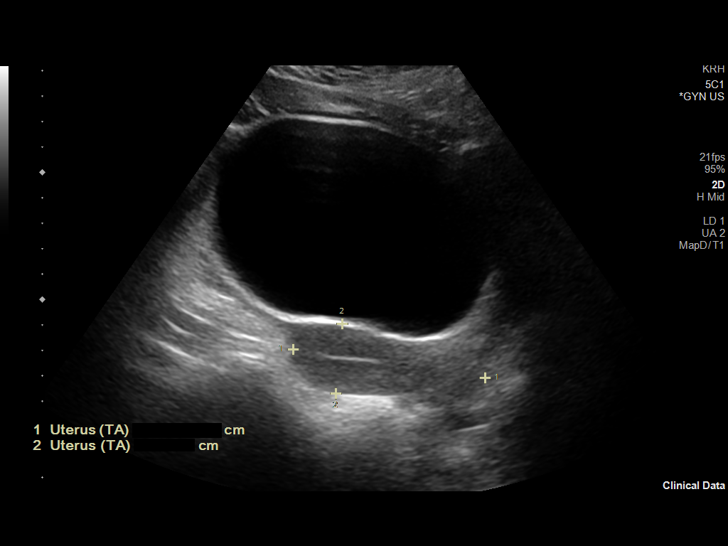
[im 6/72]
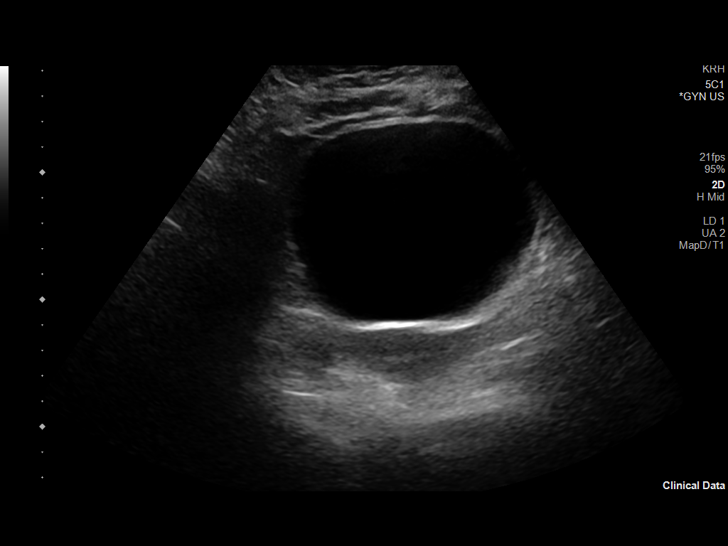
[im 12/72]
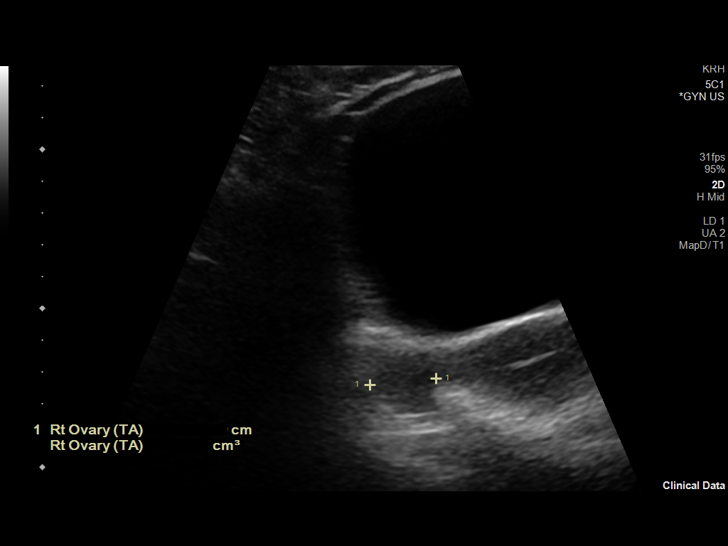
[im 18/72]
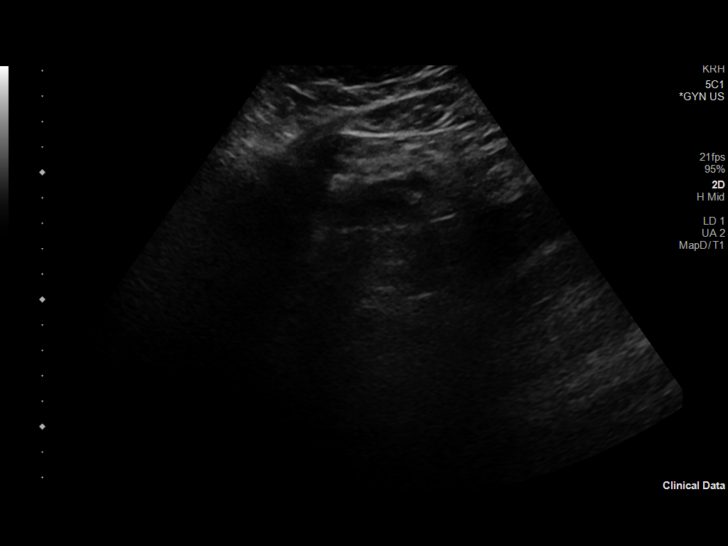
[im 24/72]
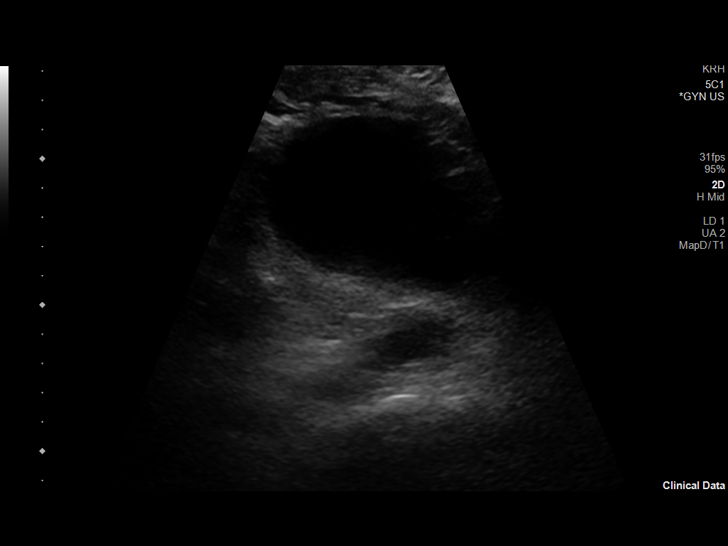
[im 30/72]
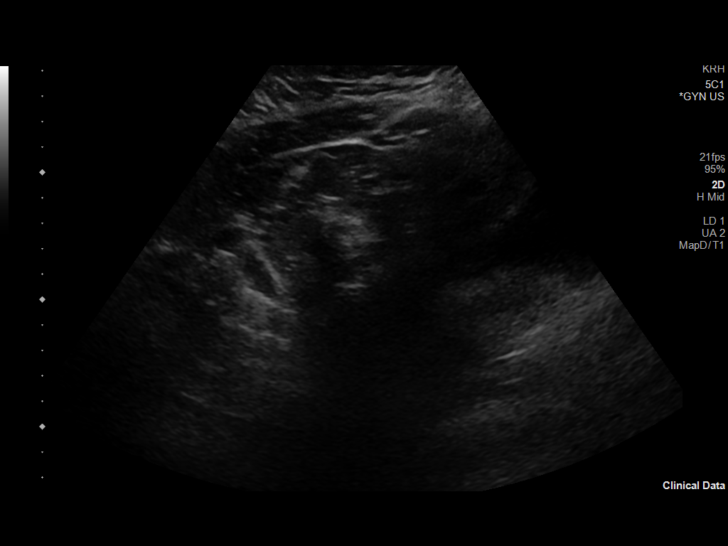
[im 36/72]
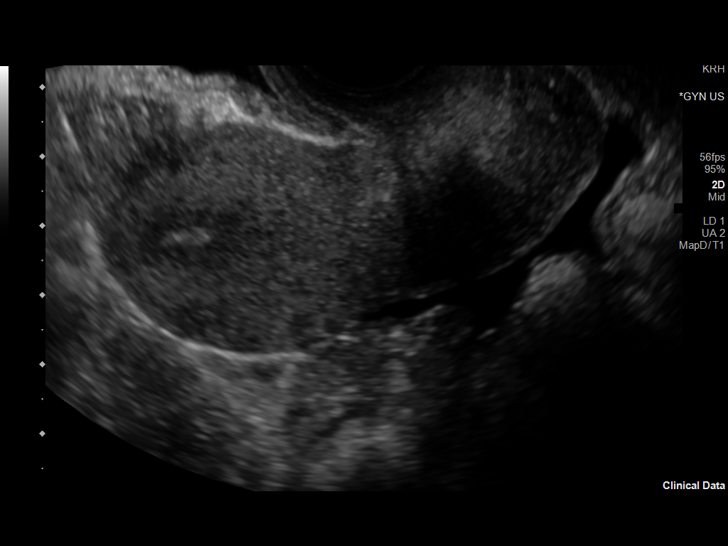
[im 42/72]
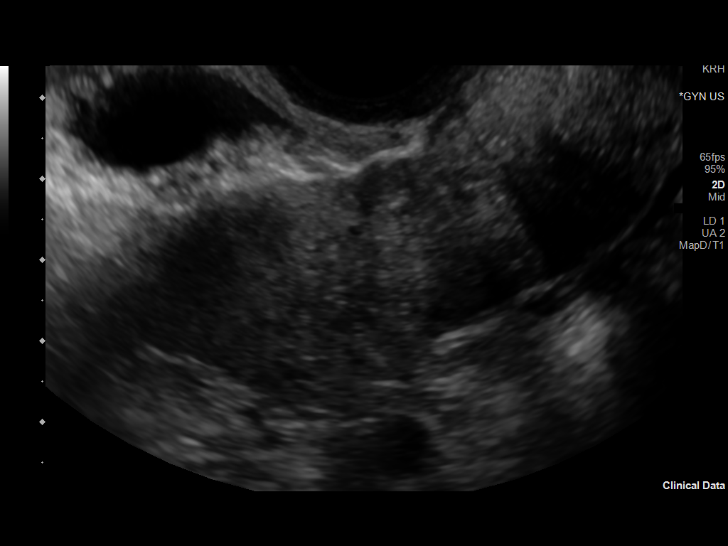
[im 48/72]
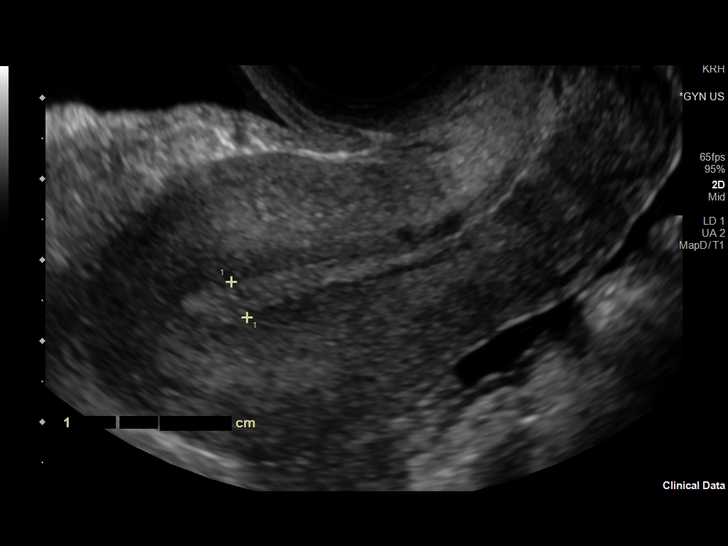
[im 54/72]
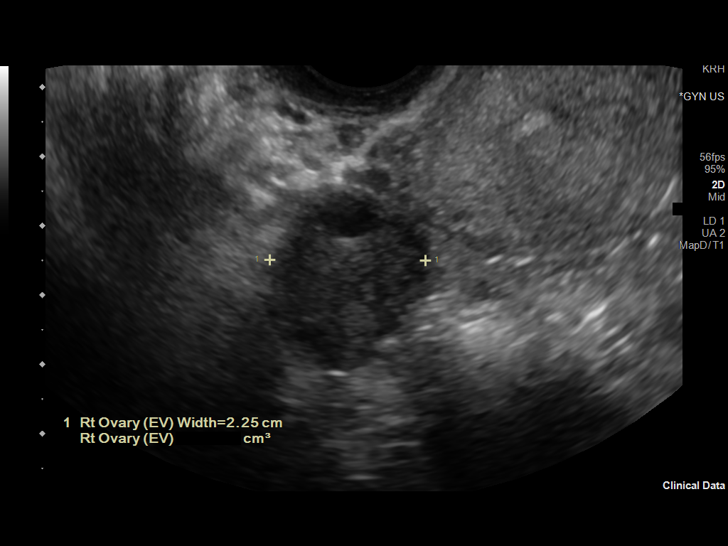
[im 60/72]
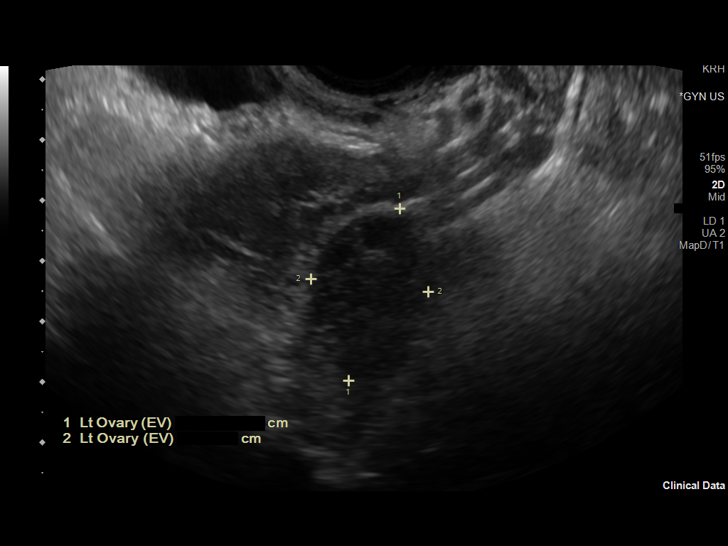
[im 66/72]
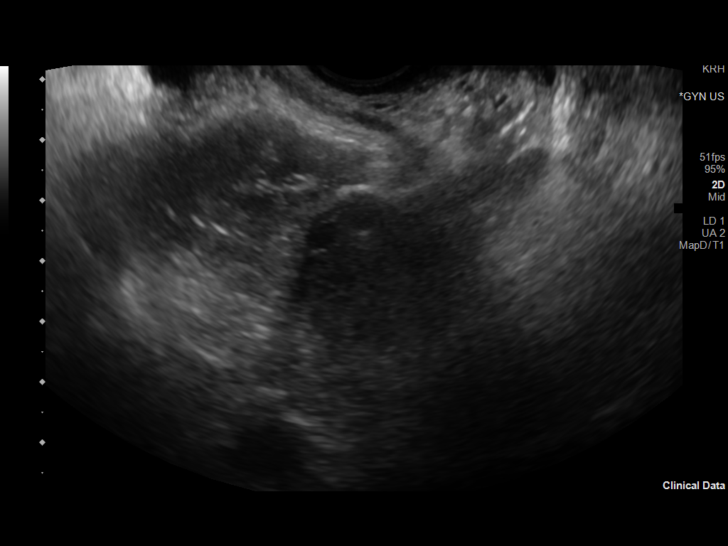
[im 72/72]
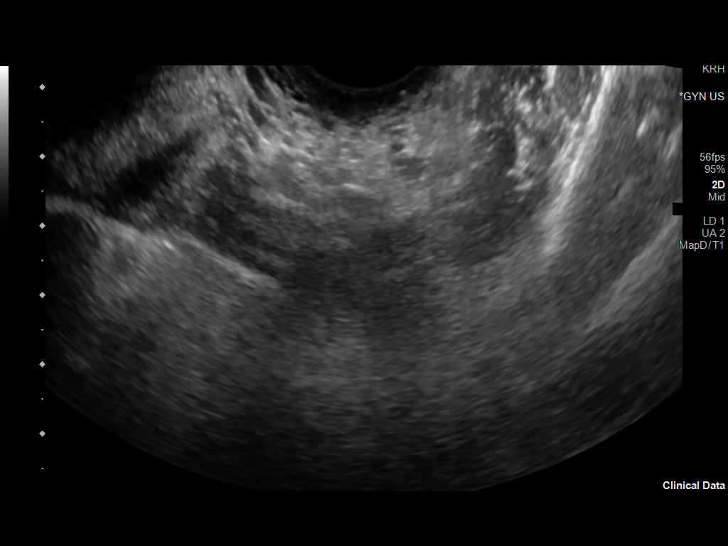

[13 of 25 positions shown; findings below may reference images not displayed]

FINDINGS: Uterus

Measurements: 7.8 x 3.6 x 4.5 cm = volume: 67.0 mL. Uterus is
anteverted. 8 x 6 x 7 mm cm subserosal fibroid present at the left
anterior uterine body.

Endometrium

Thickness: 5 mm.  No focal abnormality visualized.

Right ovary

Measurements: 2.7 x 2.2 x 2.3 cm = volume: 6.9 mL. Normal
appearance/no adnexal mass.

Left ovary

Measurements: 3.0 x 2.0 x 2.4 cm = volume: 7.2 mL. Normal
appearance/no adnexal mass.

Other findings

Small volume free fluid within the pelvis.
IMPRESSION: 1. 8 mm subserosal fibroid at the left anterior uterine body.
2. Small volume free fluid within the pelvis, presumably
physiologic.
3. Otherwise unremarkable and normal pelvic ultrasound. No other
findings to explain patient's symptoms identified.

## 2021-06-10 ENCOUNTER — Telehealth: Payer: Self-pay | Admitting: General Practice

## 2021-06-10 NOTE — Telephone Encounter (Signed)
Called patient to schedule 3 month birth control f/u (Feb 2023) with Dr. Nehemiah Settle.  Pt stated that she hasn't started the meds and will call to schedule follow up when she takes the first dose.

## 2021-06-11 ENCOUNTER — Encounter: Payer: Self-pay | Admitting: Medical

## 2021-06-11 ENCOUNTER — Telehealth: Payer: Self-pay | Admitting: Medical

## 2021-06-11 ENCOUNTER — Ambulatory Visit (INDEPENDENT_AMBULATORY_CARE_PROVIDER_SITE_OTHER): Payer: 59 | Admitting: Medical

## 2021-06-11 VITALS — BP 120/90 | HR 100 | Temp 98.4°F | Ht 64.0 in | Wt 216.0 lb

## 2021-06-11 DIAGNOSIS — R5383 Other fatigue: Secondary | ICD-10-CM

## 2021-06-11 DIAGNOSIS — M25512 Pain in left shoulder: Secondary | ICD-10-CM | POA: Diagnosis not present

## 2021-06-11 DIAGNOSIS — R1013 Epigastric pain: Secondary | ICD-10-CM | POA: Diagnosis not present

## 2021-06-11 DIAGNOSIS — Z91018 Allergy to other foods: Secondary | ICD-10-CM

## 2021-06-11 DIAGNOSIS — G43001 Migraine without aura, not intractable, with status migrainosus: Secondary | ICD-10-CM | POA: Diagnosis not present

## 2021-06-11 DIAGNOSIS — L989 Disorder of the skin and subcutaneous tissue, unspecified: Secondary | ICD-10-CM

## 2021-06-11 MED ORDER — KETOROLAC TROMETHAMINE 60 MG/2ML IM SOLN
60.0000 mg | Freq: Once | INTRAMUSCULAR | Status: AC
  Administered 2021-06-11: 60 mg via INTRAMUSCULAR

## 2021-06-11 NOTE — Progress Notes (Signed)
Subjective:    Patient ID: Paula Dixon, female    DOB: 1985/06/08, 36 y.o.   MRN: [id number]  HPI  Pt her for first time.  Pt moved from [place] about 4 years ago. Works Scientist, research (life sciences) and receiving. Not exercising on regular basis. Eats moderate healthy on and off. Sodas or coffee varies. Will avoid if hs migraines.non smoker. Occasional alcohol once a month.  Pt has migraines- she states 2 years ago saw specialist but per pt no cause found for ha. Pt has ha presently. State "not a ha is migraine".  She states 2 years ago she had scans of her head.   I see ct head last year 11-2019. "IMPRESSION: Unremarkable non-contrast CT appearance of the brain. No evidence of acute intracranial abnormality."    I found visit not to Dr. Jannifer Franklin.  AP  Assessment/Plan:   1.  Converted migraine headache   The patient is having almost daily headaches at this point.  She will go back on Topamax, increase the dose gradually to 75 mg at night, she will take Imitrex 100 mg tablets if needed and she may take over-the-counter medication for headache such as Excedrin Migraine.  The patient drinks 1 caffeinated soft drink daily, otherwise no caffeine intake.  She will follow-up here in 3 months, she will call for any dose adjustments.  Pt states she is not a pill popper.   Pt states she want to find out cause of her ha.     Pt states since October her ha became more frequently. She has light sensitivity with her HA. No nausea with ha. Sometimes will ger blurred vision with ha at times. Pt does take tyelenol or excedrin. Tylenol today but no excedrin. Current has ha level 7/10. Her had started last night.  Pt getting at least one ha a week.   Pt woke up one time with ha. Other times if wakes with ha will have migraine like ha before sleeping. Some dizziness recently.   LMP- ended on 05-29-21.   Pt states hx of 3 ulcers. 10 years since saw GI MD. She also had h pylori in the past. Recent increase in pain.  She states some bright red blood last week. Hx of one hemmhoird in the past. Pt states told by prior gi MD could not cure?    Left shoulder pain- limited rom. No known injury. Symptoms for one month.   Also pt want sto see dermatologsit for small dark spots on sides of face.  Pt states tongue swells with crabs. Pt wants to have other testing for allergies. Never had full anaphylactic type reaction.  Review of Systems  Constitutional:  Positive for fatigue.  HENT:  Negative for congestion and dental problem.   Respiratory:  Negative for cough, chest tightness, shortness of breath and wheezing.   Cardiovascular:  Negative for chest pain and palpitations.  Gastrointestinal:  Positive for abdominal pain. Negative for blood in stool and diarrhea.  Genitourinary:  Negative for dysuria and flank pain.  Musculoskeletal:  Negative for back pain.  Skin:  Negative for rash.  Neurological:  Positive for headaches. Negative for dizziness, tremors, seizures, speech difficulty, weakness and numbness.  Psychiatric/Behavioral:  Negative for behavioral problems, decreased concentration, dysphoric mood and sleep disturbance. The patient is not nervous/anxious.     Past Medical History:  Diagnosis Date   Common migraine with intractable migraine 01/10/2020   Neuromuscular disorder ([place])    Vaginal Pap smear, abnormal      Social  History   Socioeconomic History   Marital status: Single    Spouse name: Not on file   Number of children: Not on file   Years of education: Not on file   Highest education level: Not on file  Occupational History   Not on file  Tobacco Use   Smoking status: Never   Smokeless tobacco: Never  Vaping Use   Vaping Use: Never used  Substance and Sexual Activity   Alcohol use: Yes    Comment: occ   Drug use: No   Sexual activity: Yes  Other Topics Concern   Not on file  Social History Narrative   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  Caffeine 1 day.      Social Determinants of Health   Financial Resource Strain: Not on file  Food Insecurity: Not on file  Transportation Needs: Not on file  Physical Activity: Not on file  Stress: Not on file  Social Connections: Not on file  Intimate Partner Violence: Not on file    No past surgical history on file.  Family History  Problem Relation Age of Onset   Hypertension Mother    Breast cancer Other     Allergies  Allergen Reactions   Fish Allergy Anaphylaxis    Current Outpatient Medications on File Prior to Visit  Medication Sig Dispense Refill   Multiple Vitamin (MULTIVITAMIN) capsule Take 1 capsule by mouth daily.     Norethindrone Acetate-Ethinyl Estrad-FE (LOESTRIN 24 FE) 1-20 MG-MCG(24) tablet Take 1 tablet by mouth daily. 28 tablet 11   No current facility-administered medications on file prior to visit.    BP 120/90    Pulse 100    Temp 98.4 F (36.9 C)    Ht 5\' 4"  (1.626 m)    Wt 216 lb (98 kg)    SpO2 90%    BMI 37.08 kg/m       Objective:   Physical Exam   General Mental Status- Alert. General Appearance- Not in acute distress.   Skin General: Color- Normal Color. Moisture- Normal Moisture.  Neck Carotid Arteries- Normal color. Moisture- Normal Moisture. No carotid bruits. No JVD.  Chest and Lung Exam Auscultation: Breath Sounds:-Normal.  Cardiovascular Auscultation:Rythm- Regular. Murmurs & Other Heart Sounds:Auscultation of the heart reveals- No Murmurs.  Abdomen Inspection:-Inspeection Normal. Palpation/Percussion:Note:No mass. Palpation and Percussion of the abdomen reveal- Non Tender, Non Distended + BS, no rebound or guarding.    Neurologic Cranial Nerve exam:- CN III-XII intact(No nystagmus), symmetric smile. Drift Test:- No drift. Romberg Exam:- Negative.  Heal to Toe Gait exam:-Normal. Finger to Nose:- Normal/Intact Strength:- 5/5 equal and symmetric strength both upper and lower extremities.    Left shoulder- pain on palpation.  Can abduct the left arm but pain thru rom.   Skin-small dark areas on side of face.  Some on upper chest.  Probable small moles.      Assessment & Plan:   . Patient Instructions  Migraine headaches for years.  Recent increase in frequency and severity.  Rare occasional event where she wakes up in the melanite with headache.  Never tried full course of Imitrex or preventative medication.  Today with headache prescribing Toradol 60 mg IM.  Rx advisement given.  With recent new onset increased frequency decided to go ahead and place order for CT head without contrast.  Hopefully that will be prior authorized and done by early next week.  If headache does not respond to Toradol then recommend ED evaluation.  Also explained if any one-time headache with motor or sensory deficits then be seen in the emergency department as well.  I went ahead and placed referral to Dr. Lavell Anchors neurologist.  Fatigue-placed order for CBC, CMP, TSH, T4, B12, B1, vitamin D and iron.   Epigastric pain recently with history of ulcers years ago.  Along with above labs added I fob.  Turn in that early next week.  After review of labs will place referral to GI MD.  Left shoulder pain for 1 month with pain on range of motion.  Placed referral to sports medicine.  Skin lesions that are new.  Placed referral to dermatologist.  Allergy to cramps that are severe.  I recommend presently avoiding all seafood.  Went ahead and placed referral to allergist.  Follow-up in 2 to 3 weeks or sooner if needed.    Mackie Pai, PA-C   46 minutes spent with the patient multiple numerous medical problems.  Numerous referrals placed.

## 2021-06-11 NOTE — Addendum Note (Signed)
Addended by: Jeronimo Greaves on: 06/11/2021 03:19 PM   Modules accepted: Orders

## 2021-06-11 NOTE — Patient Instructions (Signed)
Migraine headaches for years.  Recent increase in frequency and severity.  Rare occasional event where she wakes up in the melanite with headache.  Never tried full course of Imitrex or preventative medication.  Today with headache prescribing Toradol 60 mg IM.  Rx advisement given.  With recent new onset increased frequency decided to go ahead and place order for CT head without contrast.  Hopefully that will be prior authorized and done by early next week.  If headache does not respond to Toradol then recommend ED evaluation.  Also explained if any one-time headache with motor or sensory deficits then be seen in the emergency department as well.  I went ahead and placed referral to Dr. Lavell Anchors neurologist.  Fatigue-placed order for CBC, CMP, TSH, T4, B12, B1, vitamin D and iron.   Epigastric pain recently with history of ulcers years ago.  Along with above labs added I fob.  Turn in that early next week.  After review of labs will place referral to GI MD.  Left shoulder pain for 1 month with pain on range of motion.  Placed referral to sports medicine.  Skin lesions that are new.  Placed referral to dermatologist.  Allergy to cramps that are severe.  I recommend presently avoiding all seafood.  Went ahead and placed referral to allergist.  Follow-up in 2 to 3 weeks or sooner if needed.

## 2021-06-11 NOTE — Telephone Encounter (Signed)
Will you see ct of head order. Try to get prior auth on Monday and get study done by wed at [facility],

## 2021-06-15 LAB — COMPREHENSIVE METABOLIC PANEL
AG Ratio: 1.4 (calc) (ref 1.0–2.5)
ALT: 13 U/L (ref 6–29)
AST: 11 U/L (ref 10–30)
Albumin: 4 g/dL (ref 3.6–5.1)
Alkaline phosphatase (APISO): 61 U/L (ref 31–125)
BUN: 11 mg/dL (ref 7–25)
CO2: 27 mmol/L (ref 20–32)
Calcium: 9.2 mg/dL (ref 8.6–10.2)
Chloride: 101 mmol/L (ref 98–110)
Creat: 0.71 mg/dL (ref 0.50–0.97)
Globulin: 2.8 g/dL (calc) (ref 1.9–3.7)
Glucose, Bld: 77 mg/dL (ref 65–99)
Potassium: 4.3 mmol/L (ref 3.5–5.3)
Sodium: 139 mmol/L (ref 135–146)
Total Bilirubin: 0.7 mg/dL (ref 0.2–1.2)
Total Protein: 6.8 g/dL (ref 6.1–8.1)

## 2021-06-15 LAB — CBC WITH DIFFERENTIAL/PLATELET
Absolute Monocytes: 396 cells/uL (ref 200–950)
Basophils Absolute: 27 cells/uL (ref 0–200)
Basophils Relative: 0.3 %
Eosinophils Absolute: 99 cells/uL (ref 15–500)
Eosinophils Relative: 1.1 %
HCT: 41.6 % (ref 35.0–45.0)
Hemoglobin: 13.9 g/dL (ref 11.7–15.5)
Lymphs Abs: 3186 cells/uL (ref 850–3900)
MCH: 29 pg (ref 27.0–33.0)
MCHC: 33.4 g/dL (ref 32.0–36.0)
MCV: 86.7 fL (ref 80.0–100.0)
MPV: 10.8 fL (ref 7.5–12.5)
Monocytes Relative: 4.4 %
Neutro Abs: 5292 cells/uL (ref 1500–7800)
Neutrophils Relative %: 58.8 %
Platelets: 320 10*3/uL (ref 140–400)
RBC: 4.8 10*6/uL (ref 3.80–5.10)
RDW: 12.5 % (ref 11.0–15.0)
Total Lymphocyte: 35.4 %
WBC: 9 10*3/uL (ref 3.8–10.8)

## 2021-06-15 LAB — IRON: Iron: 57 ug/dL (ref 40–190)

## 2021-06-15 LAB — TSH: TSH: 0.81 mIU/L

## 2021-06-15 LAB — VITAMIN D 1,25 DIHYDROXY
Vitamin D 1, 25 (OH)2 Total: 44 pg/mL (ref 18–72)
Vitamin D2 1, 25 (OH)2: <8 pg/mL
Vitamin D3 1, 25 (OH)2: 44 pg/mL

## 2021-06-15 LAB — H. PYLORI BREATH TEST: H. pylori Breath Test: DETECTED — AB

## 2021-06-15 LAB — VITAMIN B12: Vitamin B-12: 654 pg/mL (ref 200–1100)

## 2021-06-15 LAB — T4, FREE: Free T4: 1.2 ng/dL (ref 0.8–1.8)

## 2021-06-15 MED ORDER — CLARITHROMYCIN ER 500 MG PO TB24: 1000.0000 mg | ORAL_TABLET | Freq: Every day | ORAL | 0 refills | Status: DC

## 2021-06-15 MED ORDER — OMEPRAZOLE 40 MG PO CPDR: 40.0000 mg | DELAYED_RELEASE_CAPSULE | Freq: Every day | ORAL | 3 refills | Status: DC

## 2021-06-15 MED ORDER — AMOXICILLIN 875 MG PO TABS: 875.0000 mg | ORAL_TABLET | Freq: Two times a day (BID) | ORAL | 0 refills | Status: DC

## 2021-06-15 MED ORDER — AMOXICILLIN 500 MG PO CAPS: ORAL_CAPSULE | ORAL | 0 refills | Status: DC

## 2021-06-15 NOTE — Addendum Note (Signed)
Addended by: Anabel Halon on: 06/15/2021 07:09 PM   Modules accepted: Orders

## 2021-06-15 NOTE — Telephone Encounter (Signed)
Patient states she has [employer] insurance and [place] , but unsure about medicaid being still active

## 2021-06-16 LAB — VITAMIN B1: Vitamin B1 (Thiamine): 14 nmol/L (ref 8–30)

## 2021-06-16 NOTE — Addendum Note (Signed)
Addended by: Jeronimo Greaves on: 06/16/2021 09:16 AM   Modules accepted: Orders

## 2021-06-17 ENCOUNTER — Ambulatory Visit (INDEPENDENT_AMBULATORY_CARE_PROVIDER_SITE_OTHER): Payer: 59 | Admitting: Family Medicine

## 2021-06-17 ENCOUNTER — Encounter: Payer: Self-pay | Admitting: Family Medicine

## 2021-06-17 ENCOUNTER — Ambulatory Visit: Payer: Self-pay

## 2021-06-17 VITALS — BP 114/80 | Ht 64.0 in | Wt 216.0 lb

## 2021-06-17 DIAGNOSIS — M778 Other enthesopathies, not elsewhere classified: Secondary | ICD-10-CM | POA: Diagnosis not present

## 2021-06-17 DIAGNOSIS — M7552 Bursitis of left shoulder: Secondary | ICD-10-CM | POA: Diagnosis not present

## 2021-06-17 DIAGNOSIS — M25512 Pain in left shoulder: Secondary | ICD-10-CM

## 2021-06-17 MED ORDER — PREDNISONE 5 MG PO TABS: ORAL_TABLET | ORAL | 0 refills | Status: DC

## 2021-06-17 NOTE — Progress Notes (Signed)
°  Paula Dixon - 36 y.o. female MRN [id number]  Date of birth: [birth year]  SUBJECTIVE:  Including CC & ROS.  No chief complaint on file.   Paula Dixon is a 36 y.o. female that is presenting with acute left shoulder pain.  The pain is worse with reaching overhead.  Denies any specific injury.  Pain is occurring over the lateral shoulder.  No improvement with modalities.  Reviewed the note from 1/6 shows shoulder pain has been present for 1 month.   Review of Systems See HPI   HISTORY: Past Medical, Surgical, Social, and Family History Reviewed & Updated per EMR.   Pertinent Historical Findings include:  Past Medical History:  Diagnosis Date   Common migraine with intractable migraine 01/10/2020   Neuromuscular disorder ([facility])    Vaginal Pap smear, abnormal     History reviewed. No pertinent surgical history.   PHYSICAL EXAM:  VS: BP 114/80 (BP Location: Left Arm, Patient Position: Sitting)    Ht 5\' 4"  (1.626 m)    Wt 216 lb (98 kg)    BMI 37.08 kg/m  Physical Exam Gen: NAD, alert, cooperative with exam, well-appearing MSK:  Neurovascularly intact    Limited ultrasound: Left shoulder:  Normal-appearing biceps tendon. No changes of subscapularis. Mild subacromial bursitis. Mild effusion AC joint. No effusion of the glenohumeral joint.  Summary: Mild subacromial bursitis  Ultrasound and interpretation by Clearance Coots, MD    ASSESSMENT & PLAN:   Capsulitis of left shoulder Symptoms more consistent with a capsular irritation -Counseled on home exercise therapy and supportive care. -Prednisone. -Could consider physical therapy or injection.

## 2021-06-17 NOTE — Patient Instructions (Signed)
Nice to meet you Please try heat before exercise and ice after Please try the exercises   Please send me a message in MyChart with any questions or updates.  Please see me back in 4 weeks.   --Dr. Katiana Ruland  

## 2021-06-17 NOTE — Assessment & Plan Note (Signed)
Symptoms more consistent with a capsular irritation -Counseled on home exercise therapy and supportive care. -Prednisone. -Could consider physical therapy or injection.

## 2021-06-22 ENCOUNTER — Encounter: Payer: Self-pay | Admitting: Medical

## 2021-06-24 ENCOUNTER — Encounter: Payer: Self-pay | Admitting: Medical

## 2021-06-24 NOTE — Telephone Encounter (Signed)
Pt scheduled with [facility] tomorrow due to needing an PM appointment

## 2021-06-25 ENCOUNTER — Ambulatory Visit (INDEPENDENT_AMBULATORY_CARE_PROVIDER_SITE_OTHER): Payer: 59 | Admitting: Family

## 2021-06-25 VITALS — BP 125/88 | HR 70 | Temp 98.6°F | Resp 16 | Wt 217.0 lb

## 2021-06-25 DIAGNOSIS — G43909 Migraine, unspecified, not intractable, without status migrainosus: Secondary | ICD-10-CM | POA: Diagnosis not present

## 2021-06-25 DIAGNOSIS — J069 Acute upper respiratory infection, unspecified: Secondary | ICD-10-CM

## 2021-06-25 MED ORDER — BENZONATATE 100 MG PO CAPS: 100.0000 mg | ORAL_CAPSULE | Freq: Two times a day (BID) | ORAL | 0 refills | Status: DC | PRN

## 2021-06-25 MED ORDER — SUMATRIPTAN SUCCINATE 50 MG PO TABS: 50.0000 mg | ORAL_TABLET | ORAL | 5 refills | Status: AC | PRN

## 2021-06-25 MED ORDER — ONDANSETRON HCL 4 MG PO TABS: 4.0000 mg | ORAL_TABLET | Freq: Three times a day (TID) | ORAL | 0 refills | Status: DC | PRN

## 2021-06-25 NOTE — Patient Instructions (Signed)
You can use imitrex as needed for migraine. For cough you may use tessalon as needed. For nausea you can use zofran as needed.  Please keep your upcoming appointment with Neurology. Call if symptoms worsen or if they do not improve.

## 2021-06-25 NOTE — Assessment & Plan Note (Signed)
Uncontrolled. Trial of imitrex, prn zofran. Declines IM toradol today. Encouraged pt to keep her appointment with Neurology.

## 2021-06-25 NOTE — Telephone Encounter (Signed)
Patient states her ct scan got denied again and still hasn't heard anything back. Please advice.

## 2021-06-25 NOTE — Assessment & Plan Note (Signed)
Trial of tessalon prn. Pt is advised to call if symptoms worsen or if they fail to improve.

## 2021-06-25 NOTE — Progress Notes (Signed)
Subjective:     Patient ID: Paula Dixon, female    DOB: 12-Apr-1986, 36 y.o.   MRN: [id number]  Chief Complaint  Patient presents with   Migraine    Complains of on and off migraines for 2 months   Cough    Patient complains of dry cough since monday    HPI  Reports migraines for several years. They have been worsening.    Coughing and sneezing since Monday since she started abx for H pylori.  Reports that her cough has been dry.  Productive of white mucous.    Reports that she has had 2 migraines since her last visit.  Yesterday and today have been the worst.  + photophobia.  No nausea today.  Occasionally she does have nausea.     Health Maintenance Due  Topic Date Due   COVID-19 Vaccine (1) Never done   TETANUS/TDAP  Never done    Past Medical History:  Diagnosis Date   Common migraine with intractable migraine 01/10/2020   Neuromuscular disorder ([facility])    Vaginal Pap smear, abnormal     No past surgical history on file.  Family History  Problem Relation Age of Onset   Hypertension Mother    Breast cancer Other     Social History   Socioeconomic History   Marital status: Single    Spouse name: Not on file   Number of children: Not on file   Years of education: Not on file   Highest education level: Not on file  Occupational History   Not on file  Tobacco Use   Smoking status: Never   Smokeless tobacco: Never  Vaping Use   Vaping Use: Never used  Substance and Sexual Activity   Alcohol use: Yes    Comment: occ   Drug use: No   Sexual activity: Yes  Other Topics Concern   Not on file  Social History Narrative   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  Caffeine 1 day.     Social Determinants of Health   Financial Resource Strain: Not on file  Food Insecurity: Not on file  Transportation Needs: Not on file  Physical Activity: Not on file  Stress: Not on file  Social Connections: Not on file  Intimate Partner Violence: Not on file     Outpatient Medications Prior to Visit  Medication Sig Dispense Refill   Multiple Vitamin (MULTIVITAMIN) capsule Take 1 capsule by mouth daily.     Norethindrone Acetate-Ethinyl Estrad-FE (LOESTRIN 24 FE) 1-20 MG-MCG(24) tablet Take 1 tablet by mouth daily. 28 tablet 11   omeprazole (PRILOSEC) 40 MG capsule Take 1 capsule (40 mg total) by mouth daily. 30 capsule 3   clarithromycin (BIAXIN XL) 500 MG 24 hr tablet Take 2 tablets (1,000 mg total) by mouth daily. 10 tablet 0   amoxicillin (AMOXIL) 500 MG capsule 2 tab po bid x 10 days (Patient not taking: Reported on 06/25/2021) 40 capsule 0   predniSONE (DELTASONE) 5 MG tablet Take 6 pills for first day, 5 pills second day, 4 pills third day, 3 pills fourth day, 2 pills the fifth day, and 1 pill sixth day. (Patient not taking: Reported on 06/25/2021) 21 tablet 0   No facility-administered medications prior to visit.    Allergies  Allergen Reactions   Fish Allergy Anaphylaxis    ROS     Objective:    Physical Exam Constitutional:      General: She is not in acute distress.  Appearance: Normal appearance. She is well-developed.     Comments: Seated in a dark room wearing sunglasses  HENT:     Head: Normocephalic and atraumatic.     Right Ear: External ear normal.     Left Ear: External ear normal.  Eyes:     General: No scleral icterus. Neck:     Thyroid: No thyromegaly.  Cardiovascular:     Rate and Rhythm: Normal rate and regular rhythm.     Heart sounds: Normal heart sounds. No murmur heard. Pulmonary:     Effort: Pulmonary effort is normal. No respiratory distress.     Breath sounds: Normal breath sounds. No wheezing.  Musculoskeletal:     Cervical back: Neck supple.  Skin:    General: Skin is warm and dry.  Neurological:     Mental Status: She is alert and oriented to person, place, and time.  Psychiatric:        Mood and Affect: Mood normal.        Behavior: Behavior normal.        Thought Content: Thought  content normal.        Judgment: Judgment normal.    BP 125/88 (BP Location: Right Arm, Patient Position: Sitting, Cuff Size: Small)    Pulse 70    Temp 98.6 F (37 C) (Oral)    Resp 16    Wt 217 lb (98.4 kg)    SpO2 99%    BMI 37.25 kg/m  Wt Readings from Last 3 Encounters:  06/25/21 217 lb (98.4 kg)  06/17/21 216 lb (98 kg)  06/11/21 216 lb (98 kg)       Assessment & Plan:   Problem List Items Addressed This Visit       Unprioritized   Viral URI with cough - Primary    Trial of tessalon prn. Pt is advised to call if symptoms worsen or if they fail to improve.       Migraines    Uncontrolled. Trial of imitrex, prn zofran. Declines IM toradol today. Encouraged pt to keep her appointment with Neurology.       Relevant Medications   SUMAtriptan (IMITREX) 50 MG tablet    I have discontinued Latiya Schlichting's clarithromycin, amoxicillin, and predniSONE. I am also having her start on SUMAtriptan, ondansetron, and benzonatate. Additionally, I am having her maintain her multivitamin, Norethindrone Acetate-Ethinyl Estrad-FE, and omeprazole.  Meds ordered this encounter  Medications   SUMAtriptan (IMITREX) 50 MG tablet    Sig: Take 1 tablet (50 mg total) by mouth every 2 (two) hours as needed for migraine. May repeat in 2 hours if headache persists or recurs.    Dispense:  10 tablet    Refill:  5    Order Specific Question:   Supervising Provider    Answer:   Penni Homans A [[id number]]   ondansetron (ZOFRAN) 4 MG tablet    Sig: Take 1 tablet (4 mg total) by mouth every 8 (eight) hours as needed for nausea or vomiting.    Dispense:  20 tablet    Refill:  0    Order Specific Question:   Supervising Provider    Answer:   Penni Homans A [[id number]]   benzonatate (TESSALON) 100 MG capsule    Sig: Take 1 capsule (100 mg total) by mouth 2 (two) times daily as needed for cough.    Dispense:  20 capsule    Refill:  0    Order Specific Question:   Supervising Provider  Answer:   Penni Homans A [id number]

## 2021-06-26 ENCOUNTER — Telehealth: Payer: Self-pay | Admitting: Medical

## 2021-06-26 NOTE — Telephone Encounter (Signed)
I ordered ct head for pt. She states told by her insurance that they would not cover location I chose  not in network. Will you ask pt to investigate which facility they recommend. Then can place the order. Send order to that facility to coordinate her getting the CT.

## 2021-06-28 ENCOUNTER — Other Ambulatory Visit (INDEPENDENT_AMBULATORY_CARE_PROVIDER_SITE_OTHER): Payer: 59

## 2021-06-28 DIAGNOSIS — R1013 Epigastric pain: Secondary | ICD-10-CM | POA: Diagnosis not present

## 2021-06-28 LAB — FECAL OCCULT BLOOD, IMMUNOCHEMICAL: Fecal Occult Bld: NEGATIVE

## 2021-07-02 ENCOUNTER — Ambulatory Visit (HOSPITAL_BASED_OUTPATIENT_CLINIC_OR_DEPARTMENT_OTHER): Payer: 59

## 2021-07-06 ENCOUNTER — Encounter: Payer: Self-pay | Admitting: Family Medicine

## 2021-07-07 ENCOUNTER — Other Ambulatory Visit (HOSPITAL_COMMUNITY)
Admission: RE | Admit: 2021-07-07 | Discharge: 2021-07-07 | Disposition: A | Discharge status: Home / Self Care | Payer: 59 | Source: Ambulatory Visit | Attending: Obstetrics & Gynecology | Admitting: Obstetrics & Gynecology

## 2021-07-07 ENCOUNTER — Ambulatory Visit (INDEPENDENT_AMBULATORY_CARE_PROVIDER_SITE_OTHER): Payer: 59

## 2021-07-07 ENCOUNTER — Other Ambulatory Visit: Payer: Self-pay

## 2021-07-07 VITALS — BP 126/79 | HR 71 | Wt 215.0 lb

## 2021-07-07 DIAGNOSIS — N898 Other specified noninflammatory disorders of vagina: Secondary | ICD-10-CM | POA: Insufficient documentation

## 2021-07-07 NOTE — Progress Notes (Addendum)
SUBJECTIVE:  36 y.o. female complains of white vaginal discharge for 1 day(s). Denies abnormal vaginal bleeding or significant pelvic pain or fever. No UTI symptoms. Denies history of known exposure to STD.   OBJECTIVE:  She appears well, afebrile.   ASSESSMENT:  Vaginal Discharge  Vaginal Odor   PLAN:  GC, chlamydia, trichomonas, BVAG, CVAG probe sent to lab. Treatment: To be determined once lab results are received ROV prn if symptoms persist or worsen.   Attestation of Attending Supervision of RN: Evaluation and management procedures were performed by the nurse under my supervision and collaboration.  I have reviewed the nursing note and chart, and I agree with the management and plan.  Carolyn L. Harraway-Smith, M.D., Cherlynn June

## 2021-07-08 ENCOUNTER — Telehealth: Payer: Self-pay | Admitting: Medical

## 2021-07-08 DIAGNOSIS — A048 Other specified bacterial intestinal infections: Secondary | ICD-10-CM

## 2021-07-08 NOTE — Telephone Encounter (Signed)
Referral to gi placed.

## 2021-07-09 ENCOUNTER — Ambulatory Visit (HOSPITAL_BASED_OUTPATIENT_CLINIC_OR_DEPARTMENT_OTHER)
Admission: RE | Admit: 2021-07-09 | Discharge: 2021-07-09 | Disposition: A | Discharge status: Home / Self Care | Payer: 59 | Source: Ambulatory Visit | Attending: Medical | Admitting: Medical

## 2021-07-09 ENCOUNTER — Encounter: Payer: Self-pay | Admitting: Medical

## 2021-07-09 ENCOUNTER — Telehealth: Payer: Self-pay

## 2021-07-09 ENCOUNTER — Encounter: Payer: Self-pay | Admitting: Family Medicine

## 2021-07-09 ENCOUNTER — Other Ambulatory Visit: Payer: Self-pay

## 2021-07-09 DIAGNOSIS — G43001 Migraine without aura, not intractable, with status migrainosus: Secondary | ICD-10-CM | POA: Diagnosis present

## 2021-07-09 LAB — CERVICOVAGINAL ANCILLARY ONLY
Bacterial Vaginitis (gardnerella): POSITIVE — AB
Candida Glabrata: NEGATIVE
Candida Vaginitis: NEGATIVE
Chlamydia: NEGATIVE
Comment: NEGATIVE
Comment: NEGATIVE
Comment: NEGATIVE
Comment: NEGATIVE
Comment: NEGATIVE
Comment: NORMAL
Neisseria Gonorrhea: NEGATIVE
Trichomonas: NEGATIVE

## 2021-07-09 NOTE — Telephone Encounter (Signed)
Patient needs a note stating she needs to wear sunglasses at work or anything to help protect her eyes at work from light because of her migraines  and she cant work when she gets a migraine      Email :  [email or web address]

## 2021-07-12 ENCOUNTER — Other Ambulatory Visit: Payer: Self-pay

## 2021-07-12 ENCOUNTER — Other Ambulatory Visit: Payer: Self-pay | Admitting: Obstetrics & Gynecology

## 2021-07-12 MED ORDER — METRONIDAZOLE 500 MG PO TABS: 500.0000 mg | ORAL_TABLET | Freq: Two times a day (BID) | ORAL | 0 refills | Status: DC

## 2021-07-12 NOTE — Telephone Encounter (Signed)
Sent pt a mychart message. 

## 2021-07-15 ENCOUNTER — Ambulatory Visit: Payer: 59 | Admitting: Family Medicine

## 2021-07-19 ENCOUNTER — Telehealth: Payer: Self-pay | Admitting: Medical

## 2021-07-19 ENCOUNTER — Ambulatory Visit (INDEPENDENT_AMBULATORY_CARE_PROVIDER_SITE_OTHER): Payer: 59 | Admitting: Medical

## 2021-07-19 ENCOUNTER — Encounter: Payer: Self-pay | Admitting: Medical

## 2021-07-19 VITALS — BP 120/70 | HR 68 | Temp 98.4°F | Resp 16 | Ht 64.0 in | Wt 220.0 lb

## 2021-07-19 DIAGNOSIS — G43009 Migraine without aura, not intractable, without status migrainosus: Secondary | ICD-10-CM

## 2021-07-19 NOTE — Telephone Encounter (Signed)
Sent pt a mychart

## 2021-07-19 NOTE — Progress Notes (Signed)
Subjective:    Patient ID: Paula Dixon, female    DOB: Oct 22, 1985, 36 y.o.   MRN: [id number]  HPI   Pt in for some recent migraine like HA. Pt having some moderate to severe ha with light and sound sensitivity. Pt understands benefit of having fmla/days allotted if needs.  I had written imitrex for early onset migraine ha. She did have ha today but she did not take the tablet yet.   I had referred pt to Dr. Jaynee Eagles HA specialist. Pt has appt upcoming in spring. Maybe as early as march, 2023.  Current ha that had this morning has bascially subsided.    Review of Systems  Constitutional:  Negative for chills and fever.  Respiratory:  Negative for chest tightness, shortness of breath and wheezing.   Cardiovascular:  Negative for chest pain and palpitations.  Musculoskeletal:  Negative for back pain.  Neurological:  Positive for headaches. Negative for dizziness.       Residual now  Hematological:  Negative for adenopathy. Does not bruise/bleed easily.  Psychiatric/Behavioral:  Negative for behavioral problems and decreased concentration.     Past Medical History:  Diagnosis Date   Common migraine with intractable migraine 01/10/2020   Neuromuscular disorder ([place])    Vaginal Pap smear, abnormal      Social History   Socioeconomic History   Marital status: Single    Spouse name: Not on file   Number of children: Not on file   Years of education: Not on file   Highest education level: Not on file  Occupational History   Not on file  Tobacco Use   Smoking status: Never   Smokeless tobacco: Never  Vaping Use   Vaping Use: Never used  Substance and Sexual Activity   Alcohol use: Yes    Comment: occ   Drug use: No   Sexual activity: Yes  Other Topics Concern   Not on file  Social History Narrative   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  Caffeine 1 day.     Social Determinants of Health   Financial Resource Strain: Not on file  Food Insecurity: Not on file   Transportation Needs: Not on file  Physical Activity: Not on file  Stress: Not on file  Social Connections: Not on file  Intimate Partner Violence: Not on file    No past surgical history on file.  Family History  Problem Relation Age of Onset   Hypertension Mother    Breast cancer Other     Allergies  Allergen Reactions   Fish Allergy Anaphylaxis    Current Outpatient Medications on File Prior to Visit  Medication Sig Dispense Refill   benzonatate (TESSALON) 100 MG capsule Take 1 capsule (100 mg total) by mouth 2 (two) times daily as needed for cough. 20 capsule 0   metroNIDAZOLE (FLAGYL) 500 MG tablet Take 1 tablet (500 mg total) by mouth 2 (two) times daily. 14 tablet 0   Multiple Vitamin (MULTIVITAMIN) capsule Take 1 capsule by mouth daily.     Norethindrone Acetate-Ethinyl Estrad-FE (LOESTRIN 24 FE) 1-20 MG-MCG(24) tablet Take 1 tablet by mouth daily. 28 tablet 11   omeprazole (PRILOSEC) 40 MG capsule Take 1 capsule (40 mg total) by mouth daily. 30 capsule 3   ondansetron (ZOFRAN) 4 MG tablet Take 1 tablet (4 mg total) by mouth every 8 (eight) hours as needed for nausea or vomiting. 20 tablet 0   SUMAtriptan (IMITREX) 50 MG tablet Take 1 tablet (50  mg total) by mouth every 2 (two) hours as needed for migraine. May repeat in 2 hours if headache persists or recurs. 10 tablet 5   No current facility-administered medications on file prior to visit.    BP (!) 129/92 (BP Location: Right Arm, Patient Position: Sitting, Cuff Size: Normal)    Pulse 68    Temp 98.4 F (36.9 C) (Oral)    Resp 16    Ht 5\' 4"  (1.626 m)    Wt 220 lb (99.8 kg)    LMP 06/22/2021 (Exact Date)    SpO2 99%    BMI 37.76 kg/m       Objective:   Physical Exam  General Mental Status- Alert. General Appearance- Not in acute distress.   Skin General: Color- Normal Color. Moisture- Normal Moisture.  Neck Carotid Arteries- Normal color. Moisture- Normal Moisture. No carotid bruits. No JVD.  Chest and  Lung Exam Auscultation: Breath Sounds:-Normal.  Cardiovascular Auscultation:Rythm- Regular. Murmurs & Other Heart Sounds:Auscultation of the heart reveals- No Murmurs.  Abdomen Inspection:-Inspeection Normal. Palpation/Percussion:Note:No mass. Palpation and Percussion of the abdomen reveal- Non Tender, Non Distended + BS, no rebound or guarding.   Neurologic Cranial Nerve exam:- CN III-XII intact(No nystagmus), symmetric smile. Strength:- 5/5 equal and symmetric strength both upper and lower extremities.       Assessment & Plan:   Patient Instructions  Migraine headaches-today had headache earlier but now migraine type headache is almost completely resolved.  Do have Imitrex to use early onset headache.  He has not used that yet.  Recommend that you use that at home the first time to make sure he denied any major adverse side effect.  Then use at work if needed provided no major side effects.  Upcoming appointment with headache specialist.  I went ahead and filled out FMLA form.  Reviewed that with you today.  Make copies of the form and gave original to you.  Recommend that you keep a copy for yourself as well.  Follow-up in 4 to 6 weeks or sooner if needed.   Mackie Pai, PA-C

## 2021-07-19 NOTE — Patient Instructions (Signed)
Migraine headaches-today had headache earlier but now migraine type headache is almost completely resolved.  Do have Imitrex to use early onset headache.  He has not used that yet.  Recommend that you use that at home the first time to make sure he denied any major adverse side effect.  Then use at work if needed provided no major side effects.  Upcoming appointment with headache specialist.  I went ahead and filled out FMLA form.  Reviewed that with you today.  Make copies of the form and gave original to you.  Recommend that you keep a copy for yourself as well.  Follow-up in 4 to 6 weeks or sooner if needed.

## 2021-07-19 NOTE — Telephone Encounter (Signed)
Opened to review  Dawn Kiper, PA-C  

## 2021-07-19 NOTE — Telephone Encounter (Signed)
Please get pt scheduled for office visit. I have fjmla form to fill out for her ha. I have seen her once regarding HA. Need to discuss number of days of she feels she needs and frequency etc. Please get her scheduled for appointment.

## 2021-07-23 ENCOUNTER — Ambulatory Visit (INDEPENDENT_AMBULATORY_CARE_PROVIDER_SITE_OTHER): Payer: 59 | Admitting: Internal Medicine

## 2021-07-23 ENCOUNTER — Other Ambulatory Visit: Payer: Self-pay

## 2021-07-23 ENCOUNTER — Encounter: Payer: Self-pay | Admitting: Internal Medicine

## 2021-07-23 VITALS — BP 128/84 | HR 81 | Temp 98.1°F | Resp 16 | Ht 64.0 in | Wt 218.4 lb

## 2021-07-23 DIAGNOSIS — G43911 Migraine, unspecified, intractable, with status migrainosus: Secondary | ICD-10-CM

## 2021-07-23 DIAGNOSIS — J310 Chronic rhinitis: Secondary | ICD-10-CM

## 2021-07-23 DIAGNOSIS — Z0182 Encounter for allergy testing: Secondary | ICD-10-CM | POA: Diagnosis not present

## 2021-07-23 DIAGNOSIS — T7800XA Anaphylactic reaction due to unspecified food, initial encounter: Secondary | ICD-10-CM | POA: Diagnosis not present

## 2021-07-23 MED ORDER — EPINEPHRINE 0.3 MG/0.3ML IJ SOAJ: 0.3000 mg | Freq: Once | INTRAMUSCULAR | 1 refills | Status: AC

## 2021-07-23 NOTE — Patient Instructions (Signed)
Migraine Triggers: - allergy testing to foods of concern today showed no positive - environmental allergy testing also negative - try the suggestions below   Common Foods That Trigger Migraines and How to [place] ([email or web address].com/)   At the [facility]: Skip the brie, try the mozzarella. If youre going to start or end with a cheese plate, know that aged cheeses such as cheddar, blue, brie, Swiss, parmesan and Roquefort contain a natural compound called tyramine, which may trigger a migraine in some. The National Headache Foundation suggests limiting intake to four ounces for aged cheeses, but if youd rather not take any chances, go for fresh cheese like mozzarella and ricotta. At the [place]: Skip the pickles, try raw cucumber. A few favorite burger toppings can be migraine triggers for some, all thanks to tyramine, so the next time you hit up your fave joint, be wary of a few items like raw onion, cheddar or blue cheese and sauerkraut (for you non-traditionalists). Pickled food can be high in tyramine, too, so you might consider laying off that pile of pickles. It might sound weird, but raw cucumber can give you that same satisfying crunch, so you might ask your server for a swap-out. At the [facility]: Skip the pepperoni, try a classic. Aged, dried, fermented or smoked meats are (you guessed it) high in tyramine, so that pepperoni-lovers pizza could cause a migraine for some. Stick to a classic [employer] version (mozzarella cheese is a-ok), or load up your slice with veggies. At the [facility]: Skip snow peas, try anything else. Youre all good when sticking to raw, fresh veggies at the salad bar, except for snow peas, which contain tyramine. Broad beans such as favas also contain tyramine, so consider passing them by, as well. And about the dressing: Citrus such as orange, lemon and lime can contain tyramine. But the National Headache Foundations low-tyramine diet suggests limiting  citrus to half a cup serving per day, so a spritz of lemon on your salad hopefully wont be an issue. At the [facility]: Skip the teriyaki, try steamed. This one might hurt, but its true: Fermented soy products, such as miso, soy sauce, and teriyaki sauce are foods that can trigger migraines thanks to their high tyramine levels. So if this compound is a trigger for you, the sushi or teriyaki place around the corner might not be your idea of a nice lunch out. Never fear: Ask for a steamed or grilled entree instead, and learn to love your sushi without dousing it in sauce. The National Headache Foundation suggests limiting these sauces to one ounce per day. Not sure if tyramine is a trigger for you? Keeping track of what you do and dont eat in a migraine diary can help you determine which foods are migraine triggers. And once you have an idea of your triggers, restaurants can become less of a headache landmine -- and more of the enjoyable destination theyre meant to be!  Other great resources: [place] Headache Society
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
## 2021-08-02 ENCOUNTER — Other Ambulatory Visit: Payer: Self-pay

## 2021-08-02 ENCOUNTER — Encounter: Payer: Self-pay | Admitting: Gastroenterology

## 2021-08-02 ENCOUNTER — Ambulatory Visit (INDEPENDENT_AMBULATORY_CARE_PROVIDER_SITE_OTHER): Payer: 59 | Admitting: Gastroenterology

## 2021-08-02 ENCOUNTER — Ambulatory Visit: Payer: 59 | Admitting: Gastroenterology

## 2021-08-02 VITALS — BP 116/82 | HR 85 | Ht 64.0 in | Wt 212.4 lb

## 2021-08-02 DIAGNOSIS — K219 Gastro-esophageal reflux disease without esophagitis: Secondary | ICD-10-CM

## 2021-08-02 DIAGNOSIS — A048 Other specified bacterial intestinal infections: Secondary | ICD-10-CM | POA: Diagnosis not present

## 2021-08-02 DIAGNOSIS — R12 Heartburn: Secondary | ICD-10-CM | POA: Diagnosis not present

## 2021-08-02 DIAGNOSIS — R1013 Epigastric pain: Secondary | ICD-10-CM

## 2021-08-02 NOTE — Patient Instructions (Signed)
If you are age 38 or older, your body mass index should be between 23-30. Your Body mass index is 36.45 kg/m. If this is out of the aforementioned range listed, please consider follow up with your Primary Care Provider.  If you are age 43 or younger, your body mass index should be between 19-25. Your Body mass index is 36.45 kg/m. If this is out of the aformentioned range listed, please consider follow up with your Primary Care Provider.   ________________________________________________________  The [facility] providers would like to encourage you to use [facility] to communicate with providers for non-urgent requests or questions.  Due to long hold times on the telephone, sending your provider a message by [facility] may be a faster and more efficient way to get a response.  Please allow 48 business hours for a response.  Please remember that this is for non-urgent requests.  _______________________________________________________  Paula Dixon have been scheduled for an endoscopy. Please follow written instructions given to you at your visit today. If you use inhalers (even only as needed), please bring them with you on the day of your procedure.  Please call with any question or concerns.  It was a pleasure to see you today!  [name], D.O. High-Fiber Eating Plan Fiber, also called dietary fiber, is a type of carbohydrate. It is found foods such as fruits, vegetables, whole grains, and beans. A high-fiber diet can have many health benefits. Your health care provider may recommend a high-fiber diet to help: Prevent constipation. Fiber can make your bowel movements more regular. Lower your cholesterol. Relieve the following conditions: Inflammation of veins in the anus (hemorrhoids). Inflammation of specific areas of the digestive tract (uncomplicated diverticulosis). A problem of the large intestine, also called the colon, that sometimes causes pain and diarrhea (irritable bowel syndrome, or  IBS). Prevent overeating as part of a weight-loss plan. Prevent heart disease, type 2 diabetes, and certain cancers. What are tips for following this plan? Reading food labels  Check the nutrition facts label on food products for the amount of dietary fiber. Choose foods that have 5 grams of fiber or more per serving. The goals for recommended daily fiber intake include: Men (age 1 or younger): 34-38 g. Men (over age 49): 28-34 g. Women (age 47 or younger): 25-28 g. Women (over age 89): 22-25 g. Your daily fiber goal is _____________ g. Shopping Choose whole fruits and vegetables instead of processed forms, such as apple juice or applesauce. Choose a wide variety of high-fiber foods such as avocados, lentils, oats, and kidney beans. Read the nutrition facts label of the foods you choose. Be aware of foods with added fiber. These foods often have high sugar and sodium amounts per serving. Cooking Use whole-grain flour for baking and cooking. Cook with brown rice instead of white rice. Meal planning Start the day with a breakfast that is high in fiber, such as a cereal that contains 5 g of fiber or more per serving. Eat breads and cereals that are made with whole-grain flour instead of refined flour or white flour. Eat brown rice, bulgur wheat, or millet instead of white rice. Use beans in place of meat in soups, salads, and pasta dishes. Be sure that half of the grains you eat each day are whole grains. General information You can get the recommended daily intake of dietary fiber by: Eating a variety of fruits, vegetables, grains, nuts, and beans. Taking a fiber supplement if you are not able to take in enough  fiber in your diet. It is better to get fiber through food than from a supplement. Gradually increase how much fiber you consume. If you increase your intake of dietary fiber too quickly, you may have bloating, cramping, or gas. Drink plenty of water to help you digest  fiber. Choose high-fiber snacks, such as berries, raw vegetables, nuts, and popcorn. What foods should I eat? Fruits Berries. Pears. Apples. Oranges. Avocado. Prunes and raisins. Dried figs. Vegetables Sweet potatoes. Spinach. Kale. Artichokes. Cabbage. Broccoli. Cauliflower. Green peas. Carrots. Squash. Grains Whole-grain breads. Multigrain cereal. Oats and oatmeal. Brown rice. Barley. Bulgur wheat. [place]. Quinoa. Bran muffins. Popcorn. Rye wafer crackers. Meats and other proteins Navy beans, kidney beans, and pinto beans. Soybeans. Split peas. Lentils. Nuts and seeds. Dairy Fiber-fortified yogurt. Beverages Fiber-fortified soy milk. Fiber-fortified orange juice. Other foods Fiber bars. The items listed above may not be a complete list of recommended foods and beverages. Contact a dietitian for more information. What foods should I avoid? Fruits Fruit juice. Cooked, strained fruit. Vegetables Fried potatoes. Canned vegetables. Well-cooked vegetables. Grains White bread. Pasta made with refined flour. White rice. Meats and other proteins Fatty cuts of meat. Fried chicken or fried fish. Dairy Milk. Yogurt. Cream cheese. Sour cream. Fats and oils Butters. Beverages Soft drinks. Other foods Cakes and pastries. The items listed above may not be a complete list of foods and beverages to avoid. Talk with your dietitian about what choices are best for you. Summary Fiber is a type of carbohydrate. It is found in foods such as fruits, vegetables, whole grains, and beans. A high-fiber diet has many benefits. It can help to prevent constipation, lower blood cholesterol, aid weight loss, and reduce your risk of heart disease, diabetes, and certain cancers. Increase your intake of fiber gradually. Increasing fiber too quickly may cause cramping, bloating, and gas. Drink plenty of water while you increase the amount of fiber you consume. The best sources of fiber include whole fruits and  vegetables, whole grains, nuts, seeds, and beans. This information is not intended to replace advice given to you by your health care provider. Make sure you discuss any questions you have with your health care provider. Document Revised: 09/26/2019 Document Reviewed: 09/26/2019 Elsevier Patient Education  2022 [employer].

## 2021-08-02 NOTE — Progress Notes (Signed)
Chief Complaint: GERD, H pylori   Referring Provider:     Mackie Pai, PA-C   HPI:     Paula Dixon is a 36 y.o. female with a history of migraines referred to the Gastroenterology Clinic for evaluation of recent positive H. pylori and GERD.  She reports a longstanding history of intermittent reflux symptoms and prior history of ulcers. Index reflux sxs of HB, regurgitation. Present for years. No nocturnal sxs.   Also with MEG pain, sharp, stabbing pain, occurs intermittently. Seemingly random, but can be worse with stress and poor diet.  She reports a history of gastric ulcers diagnosed ~2011 in [place].   Currently prescribed Prilosec 40 mg/day, started in January. Only takes prn. Prior to that, was taking Tums ~4 days/week. No dysphagia.   H. pylori breath test on 06/11/2021 was positive.  Was treated with amoxicillin, Biaxin, and omeprazole, but only took 2-3 days of Abx and stopped d/t dizziness.  Only taking PPI  prn as above.  Otherwise normal CBC, CMP, iron, B12, B1, vitamin D, TSH, FOBT.   Past Medical History:  Diagnosis Date   Angio-edema    Common migraine with intractable migraine 01/10/2020   Neuromuscular disorder ([place])    Vaginal Pap smear, abnormal      History reviewed. No pertinent surgical history. Family History  Problem Relation Age of Onset   Hypertension Mother    Breast cancer Other    Colon cancer Paternal Uncle    Stomach cancer Cousin    Social History   Tobacco Use   Smoking status: Never   Smokeless tobacco: Never  Vaping Use   Vaping Use: Never used  Substance Use Topics   Alcohol use: Yes    Comment: occ   Drug use: No   Current Outpatient Medications  Medication Sig Dispense Refill   Multiple Vitamin (MULTIVITAMIN) capsule Take 1 capsule by mouth daily.     omeprazole (PRILOSEC) 40 MG capsule Take 1 capsule (40 mg total) by mouth daily. 30 capsule 3   Norethindrone Acetate-Ethinyl Estrad-FE (LOESTRIN 24 FE) 1-20  MG-MCG(24) tablet Take 1 tablet by mouth daily. (Patient not taking: Reported on 07/23/2021) 28 tablet 11   ondansetron (ZOFRAN) 4 MG tablet Take 1 tablet (4 mg total) by mouth every 8 (eight) hours as needed for nausea or vomiting. (Patient not taking: Reported on 08/02/2021) 20 tablet 0   SUMAtriptan (IMITREX) 50 MG tablet Take 1 tablet (50 mg total) by mouth every 2 (two) hours as needed for migraine. May repeat in 2 hours if headache persists or recurs. (Patient not taking: Reported on 07/23/2021) 10 tablet 5   No current facility-administered medications for this visit.   Allergies  Allergen Reactions   Fish Allergy Anaphylaxis     Review of Systems: All systems reviewed and negative except where noted in HPI.     Physical Exam:    Wt Readings from Last 3 Encounters:  08/02/21 212 lb 6 oz (96.3 kg)  07/23/21 218 lb 6.4 oz (99.1 kg)  07/19/21 220 lb (99.8 kg)    BP 116/82    Pulse 85    Ht 5\' 4"  (1.626 m)    Wt 212 lb 6 oz (96.3 kg)    SpO2 98%    BMI 36.45 kg/m  Constitutional:  Pleasant, in no acute distress. Psychiatric: Normal mood and affect. Behavior is normal. Cardiovascular: Normal rate, regular rhythm. No edema Pulmonary/chest: Effort normal  and breath sounds normal. No wheezing, rales or rhonchi. Abdominal: Soft, nondistended, nontender. Bowel sounds active throughout. There are no masses palpable. No hepatomegaly. Neurological: Alert and oriented to person place and time. Skin: Skin is warm and dry. No rashes noted.   ASSESSMENT AND PLAN;   1) GERD 2) Heartburn  - EGD to evaluate for erosive esophagitis, LES laxity, hiatal hernia - Start Prilosec 40 mg every morning, taking 30-60 minutes prior to breakfast - Antireflux lifestyle/dietary modifications  3) Epigastric pain 4) H. pylori breath test positive 5) Reported history of gastric ulcers - EGD with biopsies to evaluate for H. pylori, PUD, gastritis - Evaluate for clinical improvement with regular PPI  use  The indications, risks, and benefits of EGD were explained to the patient in detail. Risks include but are not limited to bleeding, perforation, adverse reaction to medications, and cardiopulmonary compromise. Sequelae include but are not limited to the possibility of surgery, hospitalization, and mortality. The patient verbalized understanding and wished to proceed. All questions answered, referred to scheduler. Further recommendations pending results of the exam.      Lavena Bullion, DO, FACG  08/02/2021, 8:27 AM   Saguier, Percell Miller, PA-C

## 2021-08-10 ENCOUNTER — Ambulatory Visit: Payer: Medicaid Other | Admitting: Adult Health

## 2021-08-11 ENCOUNTER — Encounter: Payer: Self-pay | Admitting: Medical

## 2021-08-11 ENCOUNTER — Telehealth (INDEPENDENT_AMBULATORY_CARE_PROVIDER_SITE_OTHER): Payer: 59 | Admitting: Medical

## 2021-08-11 DIAGNOSIS — H109 Unspecified conjunctivitis: Secondary | ICD-10-CM | POA: Diagnosis not present

## 2021-08-11 DIAGNOSIS — J301 Allergic rhinitis due to pollen: Secondary | ICD-10-CM

## 2021-08-11 MED ORDER — FLUTICASONE PROPIONATE 50 MCG/ACT NA SUSP: 2.0000 | Freq: Every day | NASAL | 1 refills | Status: DC

## 2021-08-11 MED ORDER — LEVOCETIRIZINE DIHYDROCHLORIDE 5 MG PO TABS: 5.0000 mg | ORAL_TABLET | Freq: Every evening | ORAL | 3 refills | Status: DC

## 2021-08-11 MED ORDER — TOBRAMYCIN 0.3 % OP SOLN: 2.0000 [drp] | Freq: Four times a day (QID) | OPHTHALMIC | 0 refills | Status: DC

## 2021-08-11 NOTE — Progress Notes (Addendum)
? ?  Subjective:  ? ? Patient ID: Paula Dixon, female    DOB: 02/24/1986, 36 y.o.   MRN: [id number] ? ?HPI ?Virtual Visit via Video Note ? ?I connected with Paula Dixon on 08/11/21 at  2:20 PM EST by a video enabled telemedicine application and verified that I am speaking with the correct person using two identifiers. ? ?Location: ?Patient: home ?Provider: office ?  ?I discussed the limitations of evaluation and management by telemedicine and the availability of in person appointments. The patient expressed understanding and agreed to proceed. ? ?History of Present Illness: ?Last week her lt eye was red with itch. She put eye drops in left eye drops. It felt better vut still sight faint irritated ? ?2 days ago rt eye got red but did not respond to allergy eye drops last 2 days rt eye lid has yellow dry crustiness in am. Has to use warm compress to break up crustiness.  ? ?Some  sneezing, runny nose and some nasal congestion over past week as well. ? ?Some mild sinus pressure. Live in [place] 3 years.  ?  ? ? ?Observations/Objective: ?General-no acute distress, pleasant, oriented. ?Lungs- on inspection lungs appear unlabored. ?Neck- no tracheal deviation or jvd on inspection. ?Neuro- gross motor function appears intact.  ?Eye-right eye by video eye looks moderately pinkish or red.  No discharge seen.  Upper and lower lids do not look swollen.  Left eye looks clear presently. ?HEENT-sounds nasally congested. ? ? ?Assessment and Plan: ?Patient Instructions  ?Recent appearance of pinkish-red eye along with yellow crusted dry matting to right eye in the morning.  Appears right eye bacterial conjunctivitis.  Prescribing Tobrex eyedrops.  You note some itching to left eye recently so I do think is best for you to go ahead and use Tobrex in both eyes. ? ?Also over the last week some suspicious allergic rhinitis signs and symptoms.  Prescribed Xyzal antihistamine and Flonase nasal spray. ? ?Recommend continue to follow-up with  allergist for more extensive testing as planned. ? ?Follow-up in 7 days or sooner if needed.  ? ?Mackie Pai, PA-C  ? ?Follow Up Instructions: ? ?  ?I discussed the assessment and treatment plan with the patient. The patient was provided an opportunity to ask questions and all were answered. The patient agreed with the plan and demonstrated an understanding of the instructions. ?  ?The patient was advised to call back or seek an in-person evaluation if the symptoms worsen or if the condition fails to improve as anticipated. ? ?Time spent with patient today was 20 minutes which consisted of chart revdiew, discussing diagnosis, work up treatment and documentation.  ? ? ?Mackie Pai, PA-C  ? ? ?Review of Systems ? ?   ?Objective:  ? Physical Exam ? ? ? ? ?   ?Assessment & Plan:  ? ? ?

## 2021-08-11 NOTE — Patient Instructions (Signed)
Recent appearance of pinkish-red eye along with yellow crusted dry matting to right eye in the morning.  Appears right eye bacterial conjunctivitis.  Prescribing Tobrex eyedrops.  You note some itching to left eye recently so I do think is best for you to go ahead and use Tobrex in both eyes. ? ?Also over the last week some suspicious allergic rhinitis signs and symptoms.  Prescribed Xyzal antihistamine and Flonase nasal spray. ? ?Recommend continue to follow-up with allergist for more extensive testing as planned. ? ?Follow-up in 7 days or sooner if needed. ?

## 2021-08-11 NOTE — Addendum Note (Signed)
Addended by: Anabel Halon on: 08/11/2021 03:19 PM ? ? Modules accepted: Level of Service ? ?

## 2021-08-18 ENCOUNTER — Encounter: Payer: Self-pay | Admitting: Gastroenterology

## 2021-08-18 ENCOUNTER — Other Ambulatory Visit (INDEPENDENT_AMBULATORY_CARE_PROVIDER_SITE_OTHER): Payer: 59

## 2021-08-18 ENCOUNTER — Other Ambulatory Visit: Payer: Self-pay | Admitting: Gastroenterology

## 2021-08-18 ENCOUNTER — Ambulatory Visit (AMBULATORY_SURGERY_CENTER): Payer: 59 | Admitting: Gastroenterology

## 2021-08-18 ENCOUNTER — Encounter: Payer: 59 | Admitting: Gastroenterology

## 2021-08-18 VITALS — BP 100/79 | HR 56 | Temp 97.8°F | Resp 15 | Ht 64.0 in | Wt 212.0 lb

## 2021-08-18 DIAGNOSIS — R1013 Epigastric pain: Secondary | ICD-10-CM

## 2021-08-18 DIAGNOSIS — R12 Heartburn: Secondary | ICD-10-CM

## 2021-08-18 DIAGNOSIS — B9681 Helicobacter pylori [H. pylori] as the cause of diseases classified elsewhere: Secondary | ICD-10-CM | POA: Diagnosis not present

## 2021-08-18 DIAGNOSIS — A048 Other specified bacterial intestinal infections: Secondary | ICD-10-CM | POA: Diagnosis not present

## 2021-08-18 DIAGNOSIS — K2950 Unspecified chronic gastritis without bleeding: Secondary | ICD-10-CM | POA: Diagnosis not present

## 2021-08-18 DIAGNOSIS — K2970 Gastritis, unspecified, without bleeding: Secondary | ICD-10-CM

## 2021-08-18 DIAGNOSIS — K219 Gastro-esophageal reflux disease without esophagitis: Secondary | ICD-10-CM

## 2021-08-18 LAB — HCG, QUANTITATIVE, PREGNANCY: Quantitative HCG: <0.6 m[IU]/mL

## 2021-08-18 MED ORDER — SUCRALFATE 1 G PO TABS: 1.0000 g | ORAL_TABLET | Freq: Three times a day (TID) | ORAL | 0 refills | Status: AC

## 2021-08-18 MED ORDER — PANTOPRAZOLE SODIUM 40 MG PO TBEC: DELAYED_RELEASE_TABLET | ORAL | 3 refills | Status: AC

## 2021-08-18 MED ORDER — SODIUM CHLORIDE 0.9 % IV SOLN: 500.0000 mL | Freq: Once | INTRAVENOUS | Status: DC

## 2021-08-18 NOTE — Progress Notes (Signed)
Report given to PACU, vss 

## 2021-08-18 NOTE — Progress Notes (Signed)
Pt's states no medical or surgical changes since previsit or office visit.  ° °VS DT °

## 2021-08-18 NOTE — Op Note (Signed)
[facility] ?Patient Name: Paula Dixon ?Procedure Date: 08/18/2021 4:40 PM ?MRN: [id number] ?Endoscopist: [name] , MD ?Age: 36 ?Referring MD:  ?Date of Birth: 1985/11/14 ?Gender: Female ?Account #: [id number] ?Procedure:                Upper GI endoscopy ?Indications:              Epigastric abdominal pain, Heartburn, Suspected  ?                          esophageal reflux, Follow-up of Helicobacter pylori ?Medicines:                Monitored Anesthesia Care ?Procedure:                Pre-Anesthesia Assessment: ?                          - Prior to the procedure, a History and Physical  ?                          was performed, and patient medications and  ?                          allergies were reviewed. The patient's tolerance of  ?                          previous anesthesia was also reviewed. The risks  ?                          and benefits of the procedure and the sedation  ?                          options and risks were discussed with the patient.  ?                          All questions were answered, and informed consent  ?                          was obtained. Prior Anticoagulants: The patient has  ?                          taken no previous anticoagulant or antiplatelet  ?                          agents. ASA Grade Assessment: II - A patient with  ?                          mild systemic disease. After reviewing the risks  ?                          and benefits, the patient was deemed in  ?                          satisfactory condition to undergo the procedure. ?  After obtaining informed consent, the endoscope was  ?                          passed under direct vision. Throughout the  ?                          procedure, the patient's blood pressure, pulse, and  ?                          oxygen saturations were monitored continuously. The  ?                          Endoscope was introduced through the mouth, and  ?                           advanced to the second part of duodenum. The upper  ?                          GI endoscopy was accomplished without difficulty.  ?                          The patient tolerated the procedure well. ?Scope In: ?Scope Out: ?Findings:                 The examined esophagus was normal. ?                          Moderate inflammation characterized by congestion  ?                          (edema) and erythema was found in the gastric  ?                          fundus, in the gastric body and in the gastric  ?                          antrum. Biopsies were taken with a cold forceps for  ?                          Helicobacter pylori testing. Estimated blood loss  ?                          was minimal. ?                          The examined duodenum was normal. ?Complications:            No immediate complications. ?Estimated Blood Loss:     Estimated blood loss was minimal. ?Impression:               - Normal esophagus. ?                          - Moderate, non-ulcer gastritis. Biopsied. ?                          -  Normal examined duodenum. ?Recommendation:           - Patient has a contact number available for  ?                          emergencies. The signs and symptoms of potential  ?                          delayed complications were discussed with the  ?                          patient. Return to normal activities tomorrow.  ?                          Written discharge instructions were provided to the  ?                          patient. ?                          - Resume previous diet. ?                          - Continue present medications. ?                          - Await pathology results. ?                          - Stop omeprazole. Start Protonix (pantoprazole) 40  ?                          mg PO BID for 6 weeks to promote healing of  ?                          gastritis, then reduce to 40 mg daily to control  ?                          reflux symptoms. ?                          - Use  sucralfate tablets 1 gram PO QID for 4 weeks. ?[name], MD ?08/18/2021 4:53:42 PM ?

## 2021-08-18 NOTE — Progress Notes (Signed)
? ?GASTROENTEROLOGY PROCEDURE H&P NOTE  ? ?Primary Care Physician: ?Mackie Pai, PA-C ? ? ? ?Reason for Procedure:  GERD, heartburn, epigastric pain, history of H. pylori ? ?Plan:    EGD with biopsies ? ?Patient is appropriate for endoscopic procedure(s) in the ambulatory ([place] [facility]) setting. ? ?The nature of the procedure, as well as the risks, benefits, and alternatives were carefully and thoroughly reviewed with the patient. Ample time for discussion and questions allowed. The patient understood, was satisfied, and agreed to proceed.  ? ? ? ?HPI: ?Paula Dixon is a 36 y.o. female who presents for EGD for evaluation of GERD, heartburn, and epigastric pain along with follow-up level H. pylori which was treated in January with triple therapy. ? ?Pre-operative hCG is negative.  ? ?Past Medical History:  ?Diagnosis Date  ? Angio-edema   ? Common migraine with intractable migraine 01/10/2020  ? Neuromuscular disorder ([place])   ? Vaginal Pap smear, abnormal   ? ? ?Past Surgical History:  ?Procedure Laterality Date  ? UPPER GASTROINTESTINAL ENDOSCOPY  08/18/2021  ? ? ?Prior to Admission medications   ?Medication Sig Start Date End Date Taking? Authorizing Provider  ?fluticasone (FLONASE) 50 MCG/ACT nasal spray Place 2 sprays into both nostrils daily. 08/11/21  Yes Saguier, Percell Miller, PA-C  ?levocetirizine (XYZAL) 5 MG tablet Take 1 tablet (5 mg total) by mouth every evening. 08/11/21  Yes Saguier, Percell Miller, PA-C  ?Multiple Vitamin (MULTIVITAMIN) capsule Take 1 capsule by mouth daily.    [provider]  ?Norethindrone Acetate-Ethinyl Estrad-FE (LOESTRIN 24 FE) 1-20 MG-MCG(24) tablet Take 1 tablet by mouth daily. ?Patient not taking: Reported on 07/23/2021 04/14/21   Truett Mainland, DO  ?omeprazole (PRILOSEC) 40 MG capsule Take 1 capsule (40 mg total) by mouth daily. 06/15/21   Saguier, Percell Miller, PA-C  ?ondansetron (ZOFRAN) 4 MG tablet Take 1 tablet (4 mg total) by mouth every 8 (eight) hours as needed for nausea or  vomiting. ?Patient not taking: Reported on 08/02/2021 06/25/21   Debbrah Alar, NP  ?SUMAtriptan (IMITREX) 50 MG tablet Take 1 tablet (50 mg total) by mouth every 2 (two) hours as needed for migraine. May repeat in 2 hours if headache persists or recurs. ?Patient not taking: Reported on 07/23/2021 06/25/21   Debbrah Alar, NP  ? ? ?Current Outpatient Medications  ?Medication Sig Dispense Refill  ? fluticasone (FLONASE) 50 MCG/ACT nasal spray Place 2 sprays into both nostrils daily. 16 g 1  ? levocetirizine (XYZAL) 5 MG tablet Take 1 tablet (5 mg total) by mouth every evening. 30 tablet 3  ? Multiple Vitamin (MULTIVITAMIN) capsule Take 1 capsule by mouth daily.    ? Norethindrone Acetate-Ethinyl Estrad-FE (LOESTRIN 24 FE) 1-20 MG-MCG(24) tablet Take 1 tablet by mouth daily. (Patient not taking: Reported on 07/23/2021) 28 tablet 11  ? omeprazole (PRILOSEC) 40 MG capsule Take 1 capsule (40 mg total) by mouth daily. 30 capsule 3  ? ondansetron (ZOFRAN) 4 MG tablet Take 1 tablet (4 mg total) by mouth every 8 (eight) hours as needed for nausea or vomiting. (Patient not taking: Reported on 08/02/2021) 20 tablet 0  ? SUMAtriptan (IMITREX) 50 MG tablet Take 1 tablet (50 mg total) by mouth every 2 (two) hours as needed for migraine. May repeat in 2 hours if headache persists or recurs. (Patient not taking: Reported on 07/23/2021) 10 tablet 5  ? ?Current Facility-Administered Medications  ?Medication Dose Route Frequency Provider Last Rate Last Admin  ? 0.9 %  sodium chloride infusion  500 mL Intravenous Once Demaris Bousquet,  Shree Espey V, DO      ? ? ?Allergies as of 08/18/2021 - Review Complete 08/18/2021  ?Allergen Reaction Noted  ? Fish allergy Anaphylaxis 10/04/2019  ? ? ?Family History  ?Problem Relation Age of Onset  ? Hypertension Mother   ? Throat cancer Father   ? Colon cancer Paternal Uncle   ? Stomach cancer Cousin   ? Breast cancer Other   ? Esophageal cancer Neg Hx   ? Rectal cancer Neg Hx   ? ? ?Social History   ? ?Socioeconomic History  ? Marital status: Single  ?  Spouse name: Not on file  ? Number of children: Not on file  ? Years of education: Not on file  ? Highest education level: Not on file  ?Occupational History  ? Not on file  ?Tobacco Use  ? Smoking status: Never  ? Smokeless tobacco: Never  ?Vaping Use  ? Vaping Use: Never used  ?Substance and Sexual Activity  ? Alcohol use: Not Currently  ?  Comment: occasionally - few times per year, 1-2 drinks at parties  ? Drug use: Never  ? Sexual activity: Yes  ?  Birth control/protection: Condom  ?Other Topics Concern  ? Not on file  ?Social History Narrative  ? 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  Caffeine 1 day.    ? ?Social Determinants of Health  ? ?Financial Resource Strain: Not on file  ?Food Insecurity: Not on file  ?Transportation Needs: Not on file  ?Physical Activity: Not on file  ?Stress: Not on file  ?Social Connections: Not on file  ?Intimate Partner Violence: Not on file  ? ? ?Physical Exam: ?Vital signs in last 24 hours: ?'@BP'$  110/64   Pulse 76   Temp 97.8 ?F (36.6 ?C) (Temporal)   Ht '5\' 4"'$  (1.626 m)   Wt 212 lb (96.2 kg)   LMP 07/20/2021 (Exact Date)   SpO2 98%   BMI 36.39 kg/m?  ?GEN: NAD ?EYE: Sclerae anicteric ?ENT: MMM ?CV: Non-tachycardic ?Pulm: CTA b/l ?GI: Soft, NT/ND ?NEURO:  Alert & Oriented x 3 ? ? ?[name], DO ?[facility] ? ? ?08/18/2021 3:42 PM ? ?

## 2021-08-18 NOTE — Progress Notes (Signed)
Called to room to assist during endoscopic procedure.  Patient ID and intended procedure confirmed with present staff. Received instructions for my participation in the procedure from the performing physician.  

## 2021-08-18 NOTE — Progress Notes (Signed)
1640 Robinul 0.1 mg IV given due large amount of secretions upon assessment.  MD made aware, vss  ?

## 2021-08-18 NOTE — Patient Instructions (Signed)
Handout on gastritis given to patient ?Resume previous diet and continue present medications. ?Continue present medications. ?Await pathology results. ?Stop omeprazole. Start protonix (pantoprazole) 40 mg twice a day for 6 weeks to promote healing of gastritis, then reduce to 40 mg daily to control reflux symptoms. ?Use sucralfate tablets 1 gram 4 times a day for 4 weeks. ? ? ?YOU HAD AN ENDOSCOPIC PROCEDURE TODAY AT [facility]:   Refer to the procedure report that was given to you for any specific questions about what was found during the examination.  If the procedure report does not answer your questions, please call your gastroenterologist to clarify.  If you requested that your care partner not be given the details of your procedure findings, then the procedure report has been included in a sealed envelope for you to review at your convenience later. ? ?YOU SHOULD EXPECT: Some feelings of bloating in the abdomen. Passage of more gas than usual.  Walking can help get rid of the air that was put into your GI tract during the procedure and reduce the bloating. If you had a lower endoscopy (such as a colonoscopy or flexible sigmoidoscopy) you may notice spotting of blood in your stool or on the toilet paper. If you underwent a bowel prep for your procedure, you may not have a normal bowel movement for a few days. ? ?Please Note:  You might notice some irritation and congestion in your nose or some drainage.  This is from the oxygen used during your procedure.  There is no need for concern and it should clear up in a day or so. ? ?SYMPTOMS TO REPORT IMMEDIATELY: ? ? ?Following upper endoscopy (EGD) ? Vomiting of blood or coffee ground material ? New chest pain or pain under the shoulder blades ? Painful or persistently difficult swallowing ? New shortness of breath ? Fever of 100?F or higher ? Black, tarry-looking stools ? ?For urgent or emergent issues, a gastroenterologist can be reached at any  hour by calling [phone]. ?Do not use MyChart messaging for urgent concerns.  ? ? ?DIET:  We do recommend a small meal at first, but then you may proceed to your regular diet.  Drink plenty of fluids but you should avoid alcoholic beverages for 24 hours. ? ?ACTIVITY:  You should plan to take it easy for the rest of today and you should NOT DRIVE or use heavy machinery until tomorrow (because of the sedation medicines used during the test).   ? ?FOLLOW UP: ?Our staff will call the number listed on your records 48-72 hours following your procedure to check on you and address any questions or concerns that you may have regarding the information given to you following your procedure. If we do not reach you, we will leave a message.  We will attempt to reach you two times.  During this call, we will ask if you have developed any symptoms of COVID 19. If you develop any symptoms (ie: fever, flu-like symptoms, shortness of breath, cough etc.) before then, please call [phone].  If you test positive for Covid 19 in the 2 weeks post procedure, please call and report this information to [place].   ? ?If any biopsies were taken you will be contacted by phone or by letter within the next 1-3 weeks.  Please call us at [phone] if you have not heard about the biopsies in 3 weeks.  ? ? ?SIGNATURES/CONFIDENTIALITY: ?You and/or your care partner have signed paperwork which  will be entered into your electronic medical record.  These signatures attest to the fact that that the information above on your After Visit Summary has been reviewed and is understood.  Full responsibility of the confidentiality of this discharge information lies with you and/or your care-partner. ?

## 2021-08-20 ENCOUNTER — Telehealth: Payer: Self-pay

## 2021-08-20 NOTE — Telephone Encounter (Signed)
?  Follow up Call- ? ?Call back number 08/18/2021  ?Post procedure Call Back phone  # [phone]  ?Permission to leave phone message Yes  ?Some recent data might be hidden  ?  ? ?Patient questions: ? ?Do you have a fever, pain , or abdominal swelling? No. ?Pain Score  0 * ? ?Have you tolerated food without any problems? Yes.   ? ?Have you been able to return to your normal activities? Yes.   ? ?Do you have any questions about your discharge instructions: ?Diet   No. ?Medications  No. ?Follow up visit  No. ? ?Do you have questions or concerns about your Care? No. ? ?Actions: ?* If pain score is 4 or above: ?No action needed, pain <4. ? ? ?

## 2021-08-20 NOTE — Telephone Encounter (Signed)
First post procedure follow up call, no answer 

## 2021-08-24 ENCOUNTER — Ambulatory Visit: Payer: 59 | Admitting: Family Medicine

## 2021-08-27 ENCOUNTER — Other Ambulatory Visit: Payer: Self-pay

## 2021-08-27 DIAGNOSIS — A048 Other specified bacterial intestinal infections: Secondary | ICD-10-CM

## 2021-08-27 MED ORDER — DOXYCYCLINE HYCLATE 100 MG PO CAPS: 100.0000 mg | ORAL_CAPSULE | Freq: Two times a day (BID) | ORAL | 0 refills | Status: AC

## 2021-08-27 MED ORDER — METRONIDAZOLE 250 MG PO TABS: 250.0000 mg | ORAL_TABLET | Freq: Four times a day (QID) | ORAL | 0 refills | Status: AC

## 2021-08-27 MED ORDER — BISMUTH SUBSALICYLATE 262 MG PO TABS: 2.0000 | ORAL_TABLET | Freq: Four times a day (QID) | ORAL | 0 refills | Status: AC

## 2021-10-29 ENCOUNTER — Ambulatory Visit: Payer: 59

## 2021-11-05 ENCOUNTER — Ambulatory Visit (INDEPENDENT_AMBULATORY_CARE_PROVIDER_SITE_OTHER): Payer: 59 | Admitting: Medical

## 2021-11-05 ENCOUNTER — Other Ambulatory Visit (HOSPITAL_COMMUNITY)
Admission: RE | Admit: 2021-11-05 | Discharge: 2021-11-05 | Disposition: A | Discharge status: Home / Self Care | Payer: 59 | Source: Ambulatory Visit | Attending: Medical | Admitting: Medical

## 2021-11-05 VITALS — BP 112/70 | HR 83 | Resp 18 | Ht 64.0 in | Wt 204.6 lb

## 2021-11-05 DIAGNOSIS — K2970 Gastritis, unspecified, without bleeding: Secondary | ICD-10-CM

## 2021-11-05 DIAGNOSIS — N898 Other specified noninflammatory disorders of vagina: Secondary | ICD-10-CM | POA: Diagnosis not present

## 2021-11-05 DIAGNOSIS — R1013 Epigastric pain: Secondary | ICD-10-CM

## 2021-11-05 DIAGNOSIS — Z113 Encounter for screening for infections with a predominantly sexual mode of transmission: Secondary | ICD-10-CM

## 2021-11-05 DIAGNOSIS — K219 Gastro-esophageal reflux disease without esophagitis: Secondary | ICD-10-CM

## 2021-11-05 DIAGNOSIS — K2990 Gastroduodenitis, unspecified, without bleeding: Secondary | ICD-10-CM

## 2021-11-05 MED ORDER — METRONIDAZOLE 500 MG PO TABS: 500.0000 mg | ORAL_TABLET | Freq: Three times a day (TID) | ORAL | 0 refills | Status: AC

## 2021-11-05 NOTE — Patient Instructions (Addendum)
Recent vaginal odor(vinegary) with recurrent bv in past. Also std screening due to ex boyfriend cheating.  Will rx flagyl to treat for bv.  Will get urine ancillary to include bv, yeast, gonorrhea, chlamydia and trichomonas.  Hiv and rpr.   If any white dc with vaginal itch while on flagyl let me know and will rx diflucan.  Follow up in date to be determined after lab review.

## 2021-11-05 NOTE — Progress Notes (Signed)
Subjective:    Patient ID: Paula Dixon, female    DOB: 11/20/1985, 36 y.o.   MRN: [id number]  HPI Pt in for report of some slight vaginal odor. Pt states she tried to get appt with gyn but they were closed. Pt had history of bv 6-7 past 1-2 years. Pt states recently her boyfriend was cheating on her. No relations for 2 weeks.  Pt in past saw Dr Nehemiah Settle.  Pt vaginal dc and no blister out break.   Pt reports occasional vaginal itch.  Lmp- Oct 16, 2021. Shorter than usual.     Review of Systems  Constitutional:  Negative for chills, fatigue and fever.  HENT:  Negative for congestion and ear pain.   Respiratory:  Negative for cough, chest tightness, shortness of breath and wheezing.   Cardiovascular:  Negative for chest pain and palpitations.  Gastrointestinal:  Negative for abdominal pain, anal bleeding and blood in stool.  Genitourinary:        Vaginal odor.  Musculoskeletal:  Negative for back pain.  Neurological:  Negative for dizziness and headaches.  Hematological:  Negative for adenopathy. Does not bruise/bleed easily.  Psychiatric/Behavioral:  Negative for behavioral problems, confusion and dysphoric mood.    Past Medical History:  Diagnosis Date   Angio-edema    Common migraine with intractable migraine 01/10/2020   Neuromuscular disorder ([place])    Vaginal Pap smear, abnormal      Social History   Socioeconomic History   Marital status: Single    Spouse name: Not on file   Number of children: Not on file   Years of education: Not on file   Highest education level: Not on file  Occupational History   Not on file  Tobacco Use   Smoking status: Never   Smokeless tobacco: Never  Vaping Use   Vaping Use: Never used  Substance and Sexual Activity   Alcohol use: Not Currently    Comment: occasionally - few times per year, 1-2 drinks at parties   Drug use: Never   Sexual activity: Yes    Birth control/protection: Condom  Other Topics Concern   Not on file   Social History Narrative   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  Caffeine 1 day.     Social Determinants of Health   Financial Resource Strain: Not on file  Food Insecurity: Not on file  Transportation Needs: Not on file  Physical Activity: Not on file  Stress: Not on file  Social Connections: Not on file  Intimate Partner Violence: Not on file    Past Surgical History:  Procedure Laterality Date   UPPER GASTROINTESTINAL ENDOSCOPY  08/18/2021    Family History  Problem Relation Age of Onset   Hypertension Mother    Throat cancer Father    Colon cancer Paternal Uncle    Stomach cancer Cousin    Breast cancer Other    Esophageal cancer Neg Hx    Rectal cancer Neg Hx     Allergies  Allergen Reactions   Fish Allergy Anaphylaxis    Current Outpatient Medications on File Prior to Visit  Medication Sig Dispense Refill   Multiple Vitamin (MULTIVITAMIN) capsule Take 1 capsule by mouth daily.     Norethindrone Acetate-Ethinyl Estrad-FE (LOESTRIN 24 FE) 1-20 MG-MCG(24) tablet Take 1 tablet by mouth daily. 28 tablet 11   omeprazole (PRILOSEC) 40 MG capsule Take 1 capsule (40 mg total) by mouth daily. 30 capsule 3   ondansetron (ZOFRAN) 4 MG tablet  Take 1 tablet (4 mg total) by mouth every 8 (eight) hours as needed for nausea or vomiting. 20 tablet 0   pantoprazole (PROTONIX) 40 MG tablet 40 mg PO BID for 6 weeks to promote healing of gastritis, then reduce to 40 mg daily to control reflux symptoms 90 tablet 3   SUMAtriptan (IMITREX) 50 MG tablet Take 1 tablet (50 mg total) by mouth every 2 (two) hours as needed for migraine. May repeat in 2 hours if headache persists or recurs. 10 tablet 5   sucralfate (CARAFATE) 1 g tablet Take 1 tablet (1 g total) by mouth 4 (four) times daily -  with meals and at bedtime. 120 tablet 0   No current facility-administered medications on file prior to visit.    BP 112/70   Pulse 83   Resp 18   Ht '5\' 4"'$  (1.626 m)   Wt 204 lb 9.6 oz  (92.8 kg)   SpO2 100%   BMI 35.12 kg/m        Objective:   Physical Exam  General Mental Status- Alert. General Appearance- Not in acute distress.   Skin General: Color- Normal Color. Moisture- Normal Moisture.   Chest and Lung Exam Auscultation: Breath Sounds:-Normal.  Cardiovascular Auscultation:Rythm- Regular. Murmurs & Other Heart Sounds:Auscultation of the heart reveals- No Murmurs.  Abdomen Inspection:-Inspeection Normal. Palpation/Percussion:Note:No mass. Palpation and Percussion of the abdomen reveal- Non Tender, Non Distended + BS, no rebound or guarding.   Neurologic Cranial Nerve exam:- CN III-XII intact(No nystagmus), symmetric smile. Strength:- 5/5 equal and symmetric strength both upper and lower extremities.   Back- no cva tenderness.     Assessment & Plan:   Patient Instructions  Recent vaginal odor(vinegary) with recurrent bv in past. Also std screening due to ex boyfriend cheating.  Will rx flagyl to treat for bv.  Will get urine ancillary to include bv, yeast, gonorrhea, chlamydia and trichomonas.  Hiv and rpr.   If any whtie dc with vaginal itch while on flagyl let me know and will rx diflucan.  Follow up in date to be determined after lab review.       Mackie Pai, PA-C

## 2021-11-08 LAB — RPR: RPR Ser Ql: NONREACTIVE

## 2021-11-08 LAB — HIV ANTIBODY (ROUTINE TESTING W REFLEX): HIV 1&2 Ab, 4th Generation: NONREACTIVE

## 2021-11-09 LAB — URINE CYTOLOGY ANCILLARY ONLY
Candida Urine: NEGATIVE
Chlamydia: NEGATIVE
Comment: NEGATIVE
Comment: NORMAL
Neisseria Gonorrhea: NEGATIVE

## 2021-11-10 ENCOUNTER — Encounter: Payer: Self-pay | Admitting: Medical

## 2021-12-27 ENCOUNTER — Emergency Department (HOSPITAL_BASED_OUTPATIENT_CLINIC_OR_DEPARTMENT_OTHER)
Admission: EM | Admit: 2021-12-27 | Discharge: 2021-12-27 | Disposition: A | Discharge status: Home / Self Care | Payer: 59 | Attending: Emergency Medicine | Admitting: Emergency Medicine

## 2021-12-27 ENCOUNTER — Other Ambulatory Visit: Payer: Self-pay

## 2021-12-27 ENCOUNTER — Emergency Department (HOSPITAL_BASED_OUTPATIENT_CLINIC_OR_DEPARTMENT_OTHER): Payer: 59

## 2021-12-27 ENCOUNTER — Encounter (HOSPITAL_BASED_OUTPATIENT_CLINIC_OR_DEPARTMENT_OTHER): Payer: Self-pay

## 2021-12-27 DIAGNOSIS — R509 Fever, unspecified: Secondary | ICD-10-CM | POA: Insufficient documentation

## 2021-12-27 DIAGNOSIS — K219 Gastro-esophageal reflux disease without esophagitis: Secondary | ICD-10-CM | POA: Insufficient documentation

## 2021-12-27 DIAGNOSIS — R110 Nausea: Secondary | ICD-10-CM | POA: Diagnosis not present

## 2021-12-27 DIAGNOSIS — R1032 Left lower quadrant pain: Secondary | ICD-10-CM | POA: Insufficient documentation

## 2021-12-27 LAB — URINALYSIS, ROUTINE W REFLEX MICROSCOPIC
Bilirubin Urine: NEGATIVE
Glucose, UA: NEGATIVE mg/dL
Hgb urine dipstick: NEGATIVE
Ketones, ur: NEGATIVE mg/dL
Leukocytes,Ua: NEGATIVE
Nitrite: NEGATIVE
Protein, ur: NEGATIVE mg/dL
Specific Gravity, Urine: 1.025 (ref 1.005–1.030)
pH: 6 (ref 5.0–8.0)

## 2021-12-27 LAB — CBC
HCT: 43.3 % (ref 36.0–46.0)
Hemoglobin: 14.1 g/dL (ref 12.0–15.0)
MCH: 28.6 pg (ref 26.0–34.0)
MCHC: 32.6 g/dL (ref 30.0–36.0)
MCV: 87.8 fL (ref 80.0–100.0)
Platelets: 252 10*3/uL (ref 150–400)
RBC: 4.93 MIL/uL (ref 3.87–5.11)
RDW: 13.2 % (ref 11.5–15.5)
WBC: 7.9 10*3/uL (ref 4.0–10.5)
nRBC: 0 % (ref 0.0–0.2)

## 2021-12-27 LAB — COMPREHENSIVE METABOLIC PANEL
ALT: 19 U/L (ref 0–44)
AST: 17 U/L (ref 15–41)
Albumin: 3.5 g/dL (ref 3.5–5.0)
Alkaline Phosphatase: 54 U/L (ref 38–126)
Anion gap: 8 (ref 5–15)
BUN: 7 mg/dL (ref 6–20)
CO2: 29 mmol/L (ref 22–32)
Calcium: 8.9 mg/dL (ref 8.9–10.3)
Chloride: 103 mmol/L (ref 98–111)
Creatinine, Ser: 0.76 mg/dL (ref 0.44–1.00)
GFR, Estimated: >60 mL/min (ref >60)
Glucose, Bld: 88 mg/dL (ref 70–99)
Potassium: 4 mmol/L (ref 3.5–5.1)
Sodium: 140 mmol/L (ref 135–145)
Total Bilirubin: 0.9 mg/dL (ref 0.3–1.2)
Total Protein: 7 g/dL (ref 6.5–8.1)

## 2021-12-27 LAB — PREGNANCY, URINE: Preg Test, Ur: NEGATIVE

## 2021-12-27 LAB — LIPASE, BLOOD: Lipase: 43 U/L (ref 11–51)

## 2021-12-27 MED ORDER — ALUM & MAG HYDROXIDE-SIMETH 200-200-20 MG/5ML PO SUSP
30.0000 mL | Freq: Once | ORAL | Status: AC
  Administered 2021-12-27: 30 mL via ORAL
  Filled 2021-12-27: qty 30

## 2021-12-27 MED ORDER — PANTOPRAZOLE SODIUM 40 MG IV SOLR: 40.0000 mg | Freq: Once | INTRAVENOUS | Status: DC

## 2021-12-27 MED ORDER — PANTOPRAZOLE SODIUM 40 MG PO TBEC
40.0000 mg | DELAYED_RELEASE_TABLET | Freq: Every day | ORAL | Status: DC
  Administered 2021-12-27: 40 mg via ORAL

## 2021-12-27 NOTE — ED Provider Notes (Signed)
[facility] Provider Note   CSN: [id number] Arrival date & time: 12/27/21  0900     History  Chief Complaint  Patient presents with   Abdominal Pain    Paula Dixon is a 36 y.o. female with medical history of GERD, recent H. pylori infection, migraines, EGD in March 2023.  The patient presents to the ED for evaluation of left lower quadrant abdominal pain.  The patient states that beginning yesterday morning, she developed left lower quadrant abdominal pain.  The patient states that sometimes this pain will also be present around her sternal area.  The patient states that these symptoms are worsened with movement, eating.  The patient does report she still has an appetite however states that the pain is worsened when she ingests food.  Patient states that last night she went home and took an unknown amount of children's Tylenol which did help alleviate her pain temporarily.  Patient also reports that she took Tums which typically helps her GERD symptoms however this did not alleviate the pain. Patient denies any recent excessive ibuprofen use.  Patient recently diagnosed with H. pylori infection however states that she did not complete courses of antibiotics due to dizziness.  Patient was recently seen by GI back in March, had EGD performed which was unremarkable.  Patient endorses subjective fever yesterday however states that she did not recorded with thermometer.  The patient denies any vomiting, diarrhea, dysuria, chest pain, shortness of breath.   Abdominal Pain Associated symptoms: fever and nausea   Associated symptoms: no chest pain, no diarrhea, no dysuria, no shortness of breath and no vomiting        Home Medications Prior to Admission medications   Medication Sig Start Date End Date Taking? Authorizing Provider  Multiple Vitamin (MULTIVITAMIN) capsule Take 1 capsule by mouth daily.    [provider]  Norethindrone Acetate-Ethinyl  Estrad-FE (LOESTRIN 24 FE) 1-20 MG-MCG(24) tablet Take 1 tablet by mouth daily. 04/14/21   Truett Mainland, DO  omeprazole (PRILOSEC) 40 MG capsule Take 1 capsule (40 mg total) by mouth daily. 06/15/21   Saguier, Percell Miller, PA-C  ondansetron (ZOFRAN) 4 MG tablet Take 1 tablet (4 mg total) by mouth every 8 (eight) hours as needed for nausea or vomiting. 06/25/21   Debbrah Alar, NP  pantoprazole (PROTONIX) 40 MG tablet 40 mg PO BID for 6 weeks to promote healing of gastritis, then reduce to 40 mg daily to control reflux symptoms 08/18/21   Cirigliano, Vito V, DO  sucralfate (CARAFATE) 1 g tablet Take 1 tablet (1 g total) by mouth 4 (four) times daily -  with meals and at bedtime. 08/18/21 09/17/21  Cirigliano, Vito V, DO  SUMAtriptan (IMITREX) 50 MG tablet Take 1 tablet (50 mg total) by mouth every 2 (two) hours as needed for migraine. May repeat in 2 hours if headache persists or recurs. 06/25/21   Debbrah Alar, NP      Allergies    Fish allergy    Review of Systems   Review of Systems  Constitutional:  Positive for fever.  Respiratory:  Negative for shortness of breath.   Cardiovascular:  Negative for chest pain.  Gastrointestinal:  Positive for abdominal pain and nausea. Negative for diarrhea and vomiting.  Genitourinary:  Negative for dysuria.  All other systems reviewed and are negative.   Physical Exam Updated Vital Signs BP 122/73 (BP Location: Left Arm)   Pulse 68   Temp 98.2 F (36.8 C) (Oral)  Resp 18   Ht '5\' 4"'$  (1.626 m)   Wt 95.3 kg   LMP 12/14/2021 (Exact Date)   SpO2 100%   BMI 36.05 kg/m  Physical Exam Vitals reviewed.  Constitutional:      General: She is not in acute distress.    Appearance: She is well-developed. She is not ill-appearing, toxic-appearing or diaphoretic.  HENT:     Head: Normocephalic and atraumatic.     Nose: Nose normal. No congestion.     Mouth/Throat:     Mouth: Mucous membranes are moist.     Pharynx: Oropharynx is clear. No  oropharyngeal exudate or posterior oropharyngeal erythema.  Eyes:     Extraocular Movements: Extraocular movements intact.     Pupils: Pupils are equal, round, and reactive to light.  Cardiovascular:     Rate and Rhythm: Normal rate and regular rhythm.  Pulmonary:     Effort: Pulmonary effort is normal.     Breath sounds: Normal breath sounds. No wheezing.  Abdominal:     General: Abdomen is flat. Bowel sounds are normal. There is no distension.     Tenderness: There is abdominal tenderness in the left lower quadrant. There is no right CVA tenderness, left CVA tenderness, guarding or rebound.     Comments: All 4 quadrants of the abdomen are soft and compressible however the patient does endorse tenderness in her left lower quadrant.  There is no overlying skin change.  There is no guarding, rebound tenderness.  Skin:    General: Skin is warm and dry.     Capillary Refill: Capillary refill takes less than 2 seconds.  Neurological:     Mental Status: She is alert and oriented to person, place, and time.     ED Results / Procedures / Treatments   Labs (all labs ordered are listed, but only abnormal results are displayed) Labs Reviewed  LIPASE, BLOOD  COMPREHENSIVE METABOLIC PANEL  CBC  URINALYSIS, ROUTINE W REFLEX MICROSCOPIC  PREGNANCY, URINE    EKG None  Radiology DG Abdomen 1 View  Result Date: 12/27/2021 CLINICAL DATA:  Abdominal pain, evaluate for pneumoperitoneum EXAM: ABDOMEN - 1 VIEW COMPARISON:  01/25/2005 FINDINGS: There is no evidence of pneumoperitoneum in the upright radiograph of the abdomen. Pelvis is not included in the image. Visualized bowel gas pattern is nonspecific. IMPRESSION: Nonspecific bowel gas pattern.  There is no pneumoperitoneum. Electronically Signed   By: Elmer Picker M.D.   On: 12/27/2021 11:00    Procedures Procedures   Medications Ordered in ED Medications  pantoprazole (PROTONIX) EC tablet 40 mg (40 mg Oral Given 12/27/21 1006)   alum & mag hydroxide-simeth (MAALOX/MYLANTA) 200-200-20 MG/5ML suspension 30 mL (30 mLs Oral Given 12/27/21 1003)    ED Course/ Medical Decision Making/ A&P                           Medical Decision Making Amount and/or Complexity of Data Reviewed Labs: ordered.   36 year old female presents to the ED for evaluation of left lower quadrant abdominal pain.  Please see HPI for further details.  On examination, the patient is in no apparent distress.  The patient is afebrile and nontachycardic.  The patient lung sounds are clear bilaterally, she is not hypoxic on room air.  The patient abdomen is soft and compressible in all 4 quadrants however the patient does endorse tenderness to her left lower quadrant without rebound or guarding.  There is no overlying skin  change.  Patient neurological examination shows no focal neurodeficits.  Patient worked up utilizing the following labs and imaging studies interpreted by me personally: - CMP unremarkable - Lipase unremarkable - Pregnancy test negative - CBC unremarkable - Urinalysis unremarkable - Plain film imaging of abdomen shows nonspecific bowel gas pattern, no pneumoperitoneum.  No free air.  Patient treated with GI cocktail, 40 mg Protonix.  The patient states she feels better after his medications were administered.  States her pain is decreased.  This patient has a GI history, history of being noncompliant on antibiotics and also being noncompliant on GERD therapy.  The patient will be discharged home and advised to follow-up with her GI doctor, Dr. Bryan Lemma.  The patient will be encouraged to start taking her Protonix every day as scheduled. The patient has been encouraged to not take this medication as needed. Prior to discharge, the patient was discussed with my attending Dr. Armandina Gemma who voiced agreement with the proposed work-up, changes were implemented where suggested.   Patient was given strict return precautions to include  increased pain, fevers, increased nausea and vomiting and the patient voiced understanding with all of these instructions.  The patient had all of her questions answered to her satisfaction prior to discharge.  The patient is stable this time for discharge home  Final Clinical Impression(s) / ED Diagnoses Final diagnoses:  Left lower quadrant abdominal pain    Rx / DC Orders ED Discharge Orders     None         Lawana Chambers 12/27/21 1150    Regan Lemming, MD 12/27/21 2001

## 2021-12-27 NOTE — ED Notes (Signed)
Began having sharp abd pains yesterday. States it has been all quads, today is more on the left side and umbilicus area. Denies N/V/D. Appetite is poor. No fevers. Attempted to eat a sandwich and was able to keep it down. But pain increases with food. Has been NPO all morning and still having abd pain.

## 2021-12-27 NOTE — Discharge Instructions (Signed)
Please return to the ED with any new symptoms such as fever up to 100.4 Fahrenheit, nausea or vomiting, increased abdominal pain Please begin taking your Protonix every day as prescribed Please follow-up with the GI doctor, Dr. Bryan Lemma Please read the attached guide concerning abdominal pain

## 2021-12-27 NOTE — ED Triage Notes (Signed)
C/o LLQ abdominal pain since yesterday with nausea.

## 2021-12-29 ENCOUNTER — Encounter: Payer: Self-pay | Admitting: Dermatology

## 2021-12-29 ENCOUNTER — Ambulatory Visit (INDEPENDENT_AMBULATORY_CARE_PROVIDER_SITE_OTHER): Payer: 59 | Admitting: Dermatology

## 2021-12-29 DIAGNOSIS — D2239 Melanocytic nevi of other parts of face: Secondary | ICD-10-CM | POA: Diagnosis not present

## 2021-12-29 DIAGNOSIS — D229 Melanocytic nevi, unspecified: Secondary | ICD-10-CM

## 2021-12-29 DIAGNOSIS — D369 Benign neoplasm, unspecified site: Secondary | ICD-10-CM

## 2021-12-29 DIAGNOSIS — Z1283 Encounter for screening for malignant neoplasm of skin: Secondary | ICD-10-CM | POA: Diagnosis not present

## 2021-12-29 DIAGNOSIS — L689 Hypertrichosis, unspecified: Secondary | ICD-10-CM

## 2021-12-29 DIAGNOSIS — L821 Other seborrheic keratosis: Secondary | ICD-10-CM

## 2021-12-29 DIAGNOSIS — L814 Other melanin hyperpigmentation: Secondary | ICD-10-CM

## 2021-12-29 NOTE — Patient Instructions (Addendum)
Vanica cream for hair growth. Can also consider laser if it bothers you a lot. Not covered by insurance.

## 2022-01-20 ENCOUNTER — Encounter: Payer: Self-pay | Admitting: Dermatology

## 2022-01-20 NOTE — Progress Notes (Signed)
   New Patient   Subjective  Paula Dixon is a 36 y.o. female who presents for the following: Annual Exam (Patient wants a skin check she has family history of boils and cancer. She has a lesion on the groin she wants checked ).  Skin check, several issues she would like to discuss Location:  Duration:  Quality:  Associated Signs/Symptoms: Modifying Factors:  Severity:  Timing: Context:    The following portions of the chart were reviewed this encounter and updated as appropriate:  Tobacco  Allergies  Meds  Problems  Med Hx  Surg Hx  Fam Hx      Objective  Well appearing patient in no apparent distress; mood and affect are within normal limits. Full body skin exam.  No atypical pigmented lesions (all checked with dermoscopy), no nonmelanoma skin cancer.  Several half millimeter reticulated papules  Chest - Medial (Center), Left Breast, Right Inframammary Fold Textured brown 5 mm flattopped papule, compatible dermoscopy  Dorsum of Nose True mole on nose.  I do not monochrome 3 mm, no dermoscopic atypia.  Historically stable  Right Labium Minus 4 mm monochrome symmetric brown macule, dermoscopy difficult.  Mid Chin, Right Submental Area Mild localized hypotrichosis    A full examination was performed including scalp, head, eyes, ears, nose, lips, neck, chest, axillae, abdomen, back, buttocks, bilateral upper extremities, bilateral lower extremities, hands, feet, fingers, toes, fingernails, and toenails. All findings within normal limits unless otherwise noted below.  Areas beneath undergarments not fully examined (vulva area was examined).   Assessment & Plan  Screening for malignant neoplasm of skin  With her family history of skin cancer, patient may choose to see a dermatologist for skin examination every 1 to 3 years.  Encouraged to self examine twice annually.  Seborrheic keratosis (3) Chest - Medial [facility]); Left Breast; Right Inframammary Fold  Leave  if stable.  Melanocytic nevus, unspecified location Dorsum of Nose  Recheck if there is clinical change.  Papilloma  Intervention not necessary  Lentigo Right Labium Minus  she can have this spot checked during her annual GYN visit and I will recheck on a as needed basis.  Excessive hair growth (2) Right Submental Area; Mid Chin  Patient is going to read on vanica cream. IF she would like to try it she can call for Rx. Also can see someone about laser.

## 2022-01-31 ENCOUNTER — Ambulatory Visit
Admission: EM | Admit: 2022-01-31 | Discharge: 2022-01-31 | Disposition: A | Discharge status: Home / Self Care | Payer: Commercial Managed Care - HMO | Attending: Family Medicine | Admitting: Family Medicine

## 2022-01-31 ENCOUNTER — Telehealth: Payer: Self-pay | Admitting: Family Medicine

## 2022-01-31 ENCOUNTER — Encounter: Payer: Self-pay | Admitting: Family Medicine

## 2022-01-31 DIAGNOSIS — Z8616 Personal history of COVID-19: Secondary | ICD-10-CM | POA: Diagnosis not present

## 2022-01-31 DIAGNOSIS — Z9189 Other specified personal risk factors, not elsewhere classified: Secondary | ICD-10-CM | POA: Diagnosis not present

## 2022-01-31 DIAGNOSIS — R6889 Other general symptoms and signs: Secondary | ICD-10-CM

## 2022-01-31 DIAGNOSIS — U071 COVID-19: Secondary | ICD-10-CM | POA: Insufficient documentation

## 2022-01-31 LAB — SARS CORONAVIRUS 2 BY RT PCR: SARS Coronavirus 2 by RT PCR: POSITIVE — AB

## 2022-01-31 MED ORDER — ONDANSETRON 8 MG PO TBDP: 8.0000 mg | ORAL_TABLET | Freq: Three times a day (TID) | ORAL | 0 refills | Status: AC | PRN

## 2022-01-31 NOTE — Telephone Encounter (Signed)
Notified patient that her COVID is positive Discussed symptomatic care at home Discussed quarantine and mask recommendations Will call for questions or problems Will need a note that keeps her out of work for the 5 days of quarantine

## 2022-01-31 NOTE — ED Provider Notes (Signed)
[facility]    CSN: [id number] Arrival date & time: 01/31/22  1012      History   Chief Complaint Chief Complaint  Patient presents with   Emesis   Cough   Chills    HPI Paula Dixon is a 36 y.o. female.   HPI  Patient started getting sick yesterday.  She has fever and chills.  Body aches fatigue.  Emesis yesterday.  Some coughing.  She also has some stomach pain and gastritis.  She takes pantoprazole as needed for history of stomach problems.  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  She declines  Past Medical History:  Diagnosis Date   Angio-edema    Common migraine with intractable migraine 01/10/2020   Neuromuscular disorder ([place])    Vaginal Pap smear, abnormal     Patient Active Problem List   Diagnosis Date Noted   Migraines 06/25/2021   Viral URI with cough 06/25/2021   Capsulitis of left shoulder 06/17/2021   Breast mass, right 12/29/2020   Low back pain 03/03/2020   Oral contraceptive prescribed 03/03/2020   Depression 03/03/2020   Common migraine with intractable migraine 01/10/2020    Past Surgical History:  Procedure Laterality Date   UPPER GASTROINTESTINAL ENDOSCOPY  08/18/2021    OB History     Gravida  4   Para  1   Term  1   Preterm      AB  2   Living  1      SAB      IAB  1   Ectopic  1   Multiple      Live Births  1            Home Medications    Prior to Admission medications   Medication Sig Start Date End Date Taking? Authorizing Provider  ondansetron (ZOFRAN-ODT) 8 MG disintegrating tablet Take 1 tablet (8 mg total) by mouth every 8 (eight) hours as needed for nausea or vomiting. 01/31/22  Yes Raylene Everts, MD  pantoprazole (PROTONIX)  40 MG tablet 40 mg PO BID for 6 weeks to promote healing of gastritis, then reduce to 40 mg daily to control reflux symptoms 08/18/21   Cirigliano, Vito V, DO  sucralfate (CARAFATE) 1 g tablet Take 1 tablet (1 g total) by mouth 4 (four) times daily -  with meals and at bedtime. 08/18/21 09/17/21  Cirigliano, Vito V, DO  SUMAtriptan (IMITREX) 50 MG tablet Take 1 tablet (50 mg total) by mouth every 2 (two) hours as needed for migraine. May repeat in 2 hours if headache persists or recurs. 06/25/21   Debbrah Alar, NP    Family History Family History  Problem Relation Age of Onset   Hypertension Mother    Throat cancer Father    Colon cancer Paternal Uncle    Stomach cancer Cousin    Breast cancer Other    Esophageal cancer Neg Hx    Rectal cancer Neg Hx     Social History Social History   Tobacco Use   Smoking status: Never   Smokeless tobacco: Never  Vaping Use   Vaping Use: Never used  Substance Use Topics   Alcohol use: Not Currently  Comment: occasionally - few times per year, 1-2 drinks at parties   Drug use: Never     Allergies   Fish allergy   Review of Systems Review of Systems  See HPI Physical Exam Triage Vital Signs ED Triage Vitals  Enc Vitals Group     BP 01/31/22 1031 113/80     Pulse Rate 01/31/22 1031 98     Resp 01/31/22 1031 17     Temp 01/31/22 1031 99.4 F (37.4 C)     Temp Source 01/31/22 1031 Oral     SpO2 01/31/22 1031 98 %     Weight --      Height --      Head Circumference --      Peak Flow --      Pain Score 01/31/22 1033 10     Pain Loc --      Pain Edu? --      Excl. in [place]? --    No data found.  Updated Vital Signs BP 113/80 (BP Location: Left Arm)   Pulse 98   Temp 99.4 F (37.4 C) (Oral)   Resp 17   LMP 01/14/2022   SpO2 98%       Physical Exam Constitutional:      General: She is not in acute distress.    Appearance: She is well-developed. She is obese. She is ill-appearing.  HENT:     Head: Normocephalic  and atraumatic.  Eyes:     Conjunctiva/sclera: Conjunctivae normal.     Pupils: Pupils are equal, round, and reactive to light.  Cardiovascular:     Rate and Rhythm: Normal rate and regular rhythm.     Heart sounds: Normal heart sounds.  Pulmonary:     Effort: Pulmonary effort is normal. No respiratory distress.     Breath sounds: Normal breath sounds.  Abdominal:     General: There is no distension.     Palpations: Abdomen is soft.  Musculoskeletal:        General: Normal range of motion.     Cervical back: Normal range of motion.  Skin:    General: Skin is warm and dry.  Neurological:     Mental Status: She is alert.  Psychiatric:        Mood and Affect: Mood normal.        Behavior: Behavior normal.      UC Treatments / Results  Labs (all labs ordered are listed, but only abnormal results are displayed) Labs Reviewed  SARS CORONAVIRUS 2 BY RT PCR    EKG   Radiology No results found.  Procedures Procedures (including critical care time)  Medications Ordered in UC Medications - No data to display  Initial Impression / Assessment and Plan / UC Course  I have reviewed the triage vital signs and the nursing notes.  Pertinent labs & imaging results that were available during my care of the patient were reviewed by me and considered in my medical decision making (see chart for details).     PCR testing should be back later this afternoon.  Patient will be called if her test is positive.  Otherwise symptomatic treat-ment for viral symptoms Final Clinical Impressions(s) / UC Diagnoses   Final diagnoses:  At increased risk of exposure to COVID-19 virus  Flu-like symptoms     Discharge Instructions      Take Zofran for nausea Increase your fluids Check MyChart for your test result You will be called if your COVID  is positive   ED Prescriptions     Medication Sig Dispense Auth. Provider   ondansetron (ZOFRAN-ODT) 8 MG disintegrating tablet Take 1  tablet (8 mg total) by mouth every 8 (eight) hours as needed for nausea or vomiting. 20 tablet Raylene Everts, MD      PDMP not reviewed this encounter.   Raylene Everts, MD 01/31/22 [phone]

## 2022-01-31 NOTE — ED Triage Notes (Signed)
Pt c/o cold sxs since yesterday. Started having stomach pains and vomiting last night. Hx of gastritis. Also here for covid testing required by work. Takes pantoprazole prn.

## 2022-01-31 NOTE — Discharge Instructions (Signed)
Take Zofran for nausea Increase your fluids Check MyChart for your test result You will be called if your COVID is positive

## 2022-02-01 ENCOUNTER — Telehealth: Payer: Self-pay | Admitting: Emergency Medicine

## 2022-03-09 IMAGING — CT CT HEAD W/O CM
3 series · 15 of 47 positions shown, 18 images · non-contrast
Comparison: 11/06/2019

CLINICAL DATA: Chronic headache, history of migraines and light
sensitivity



[Series 2: head wo · axial · 0.48mm/px · z∈[+921,+1046]mm · 9 of 31 slices shown, 12 images]
[im 3/31  brain]
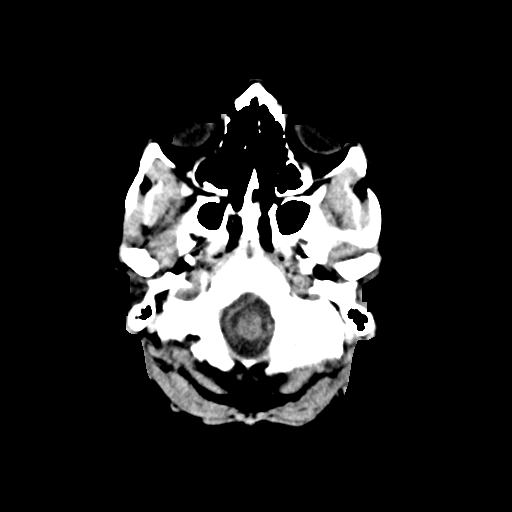
[im 3/31  bone]
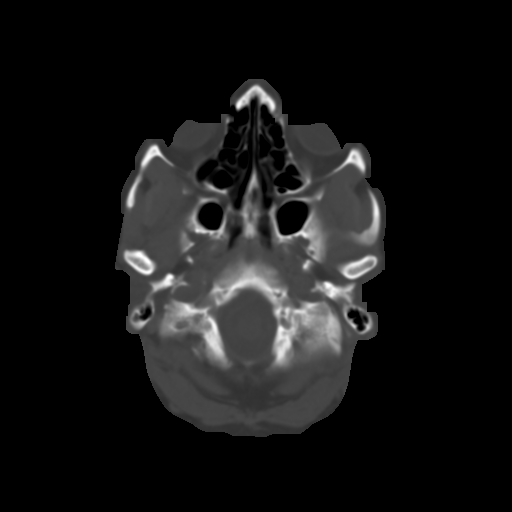
[im 6/31  brain]
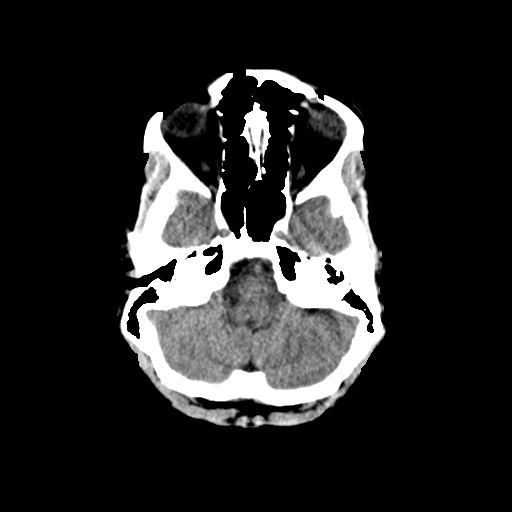
[im 9/31  brain]
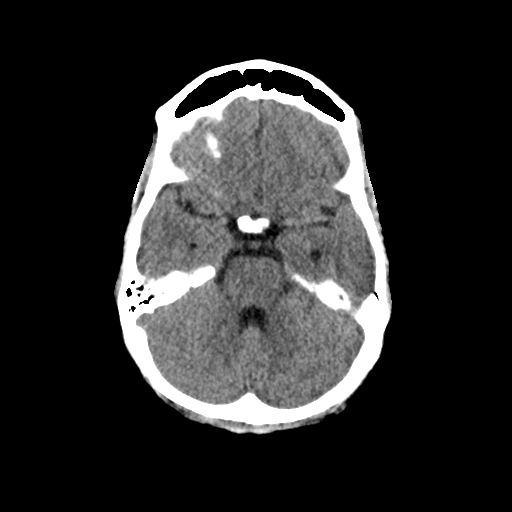
[im 12/31  brain]
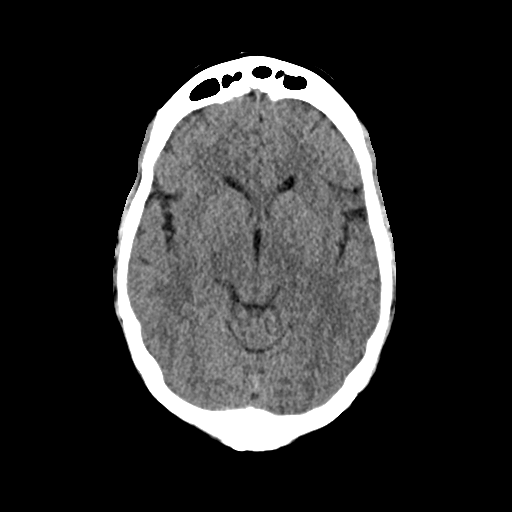
[im 16/31  brain]
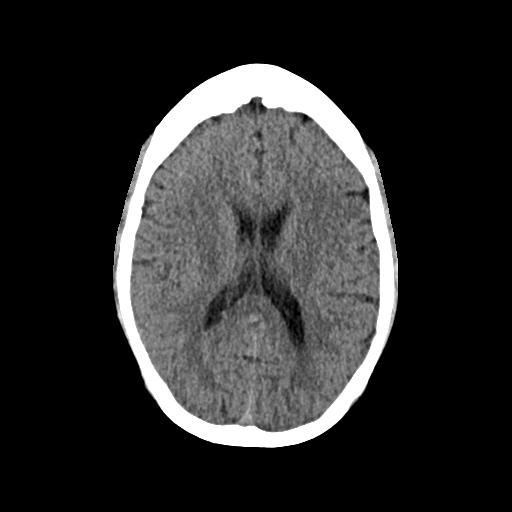
[im 16/31  bone]
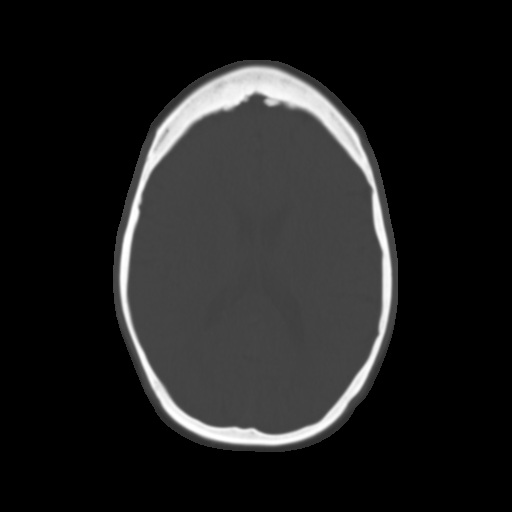
[im 19/31  brain]
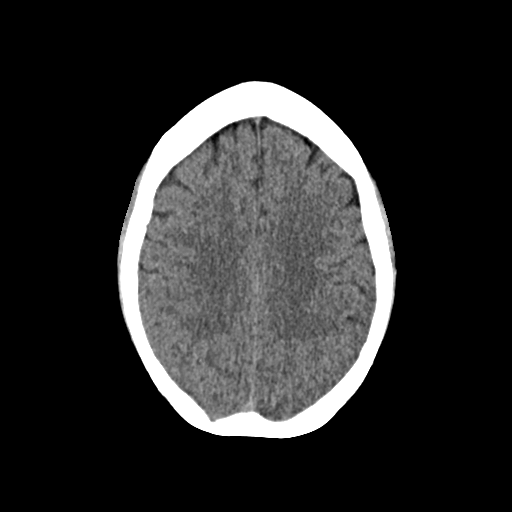
[im 22/31  brain]
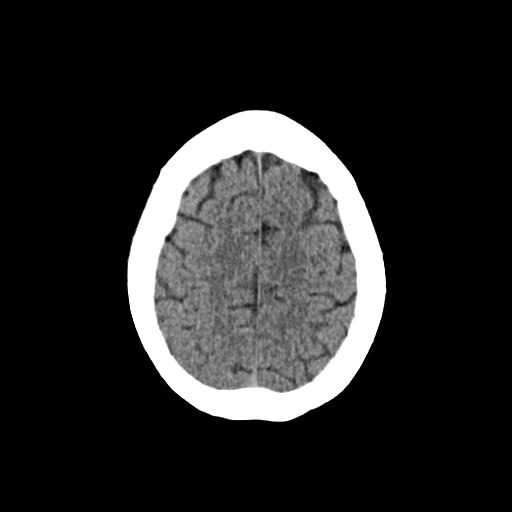
[im 25/31  brain]
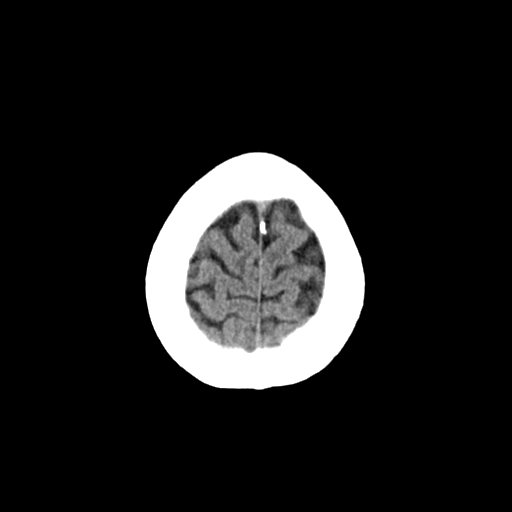
[im 28/31  brain]
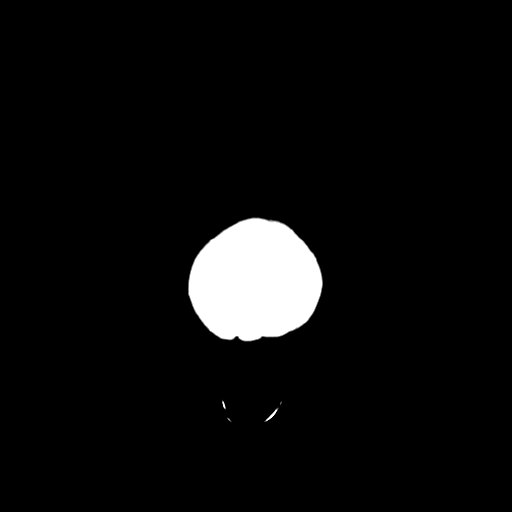
[im 28/31  bone]
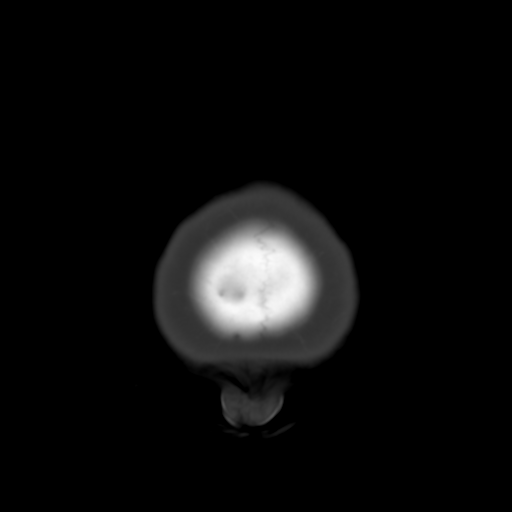

[Series 4: coronal soft · coronal · 0.31mm/px · 3 of 69 slices shown]
[im 23/69  brain]
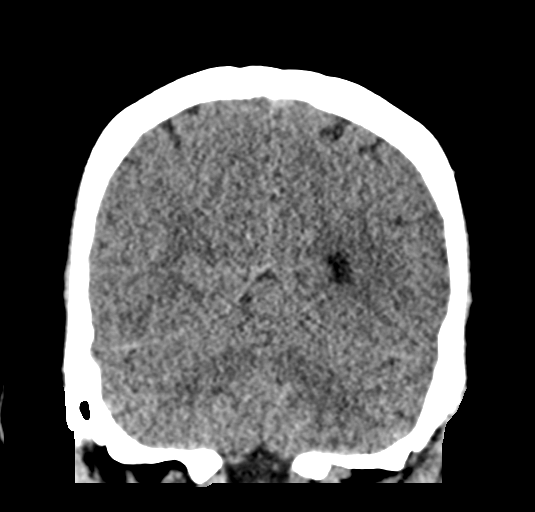
[im 31/69  brain]
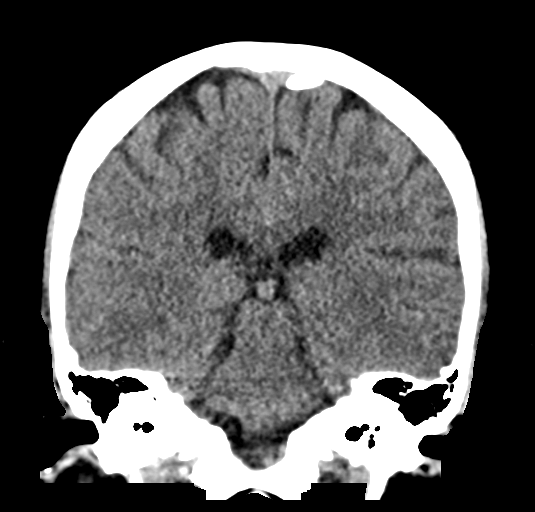
[im 38/69  brain]
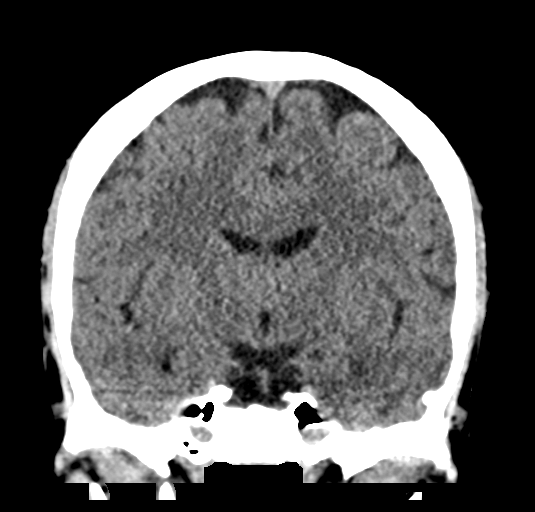

[Series 5: sag soft · sagittal · 0.31mm/px · 3 of 53 slices shown]
[im 18/53  brain]
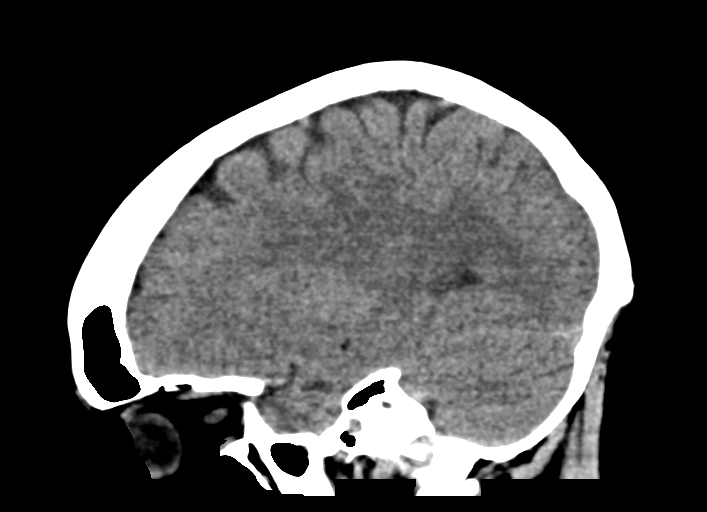
[im 27/53  brain]
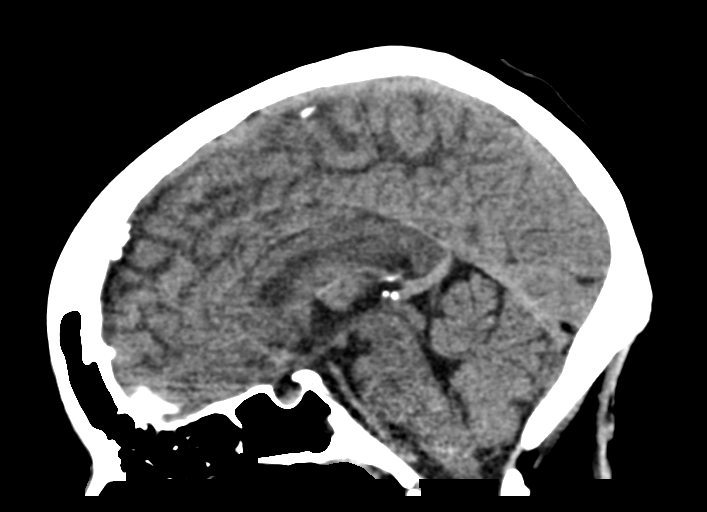
[im 35/53  brain]
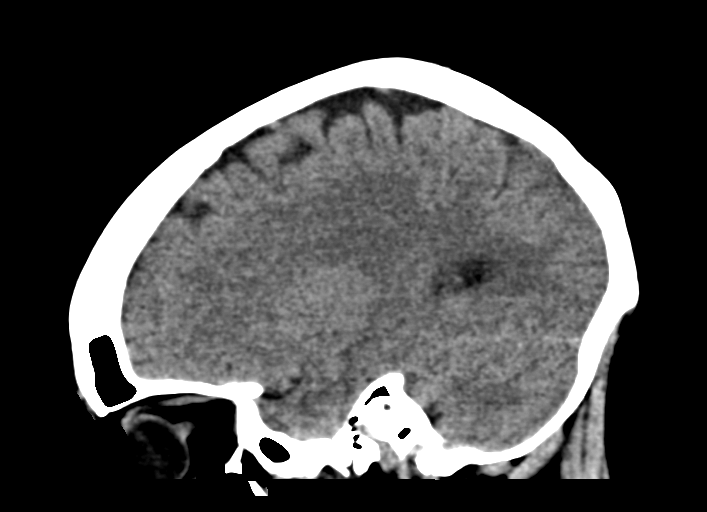

[15 of 47 positions shown; findings below may reference images not displayed]

FINDINGS: Brain: No evidence of acute infarction, hemorrhage, hydrocephalus,
extra-axial collection or mass lesion/mass effect.

Vascular: No hyperdense vessel or unexpected calcification.

Skull: Normal. Negative for fracture or focal lesion.

Sinuses/Orbits: No acute finding.

Other: None.
IMPRESSION: No acute intracranial pathology. No noncontrast CT findings to
explain headaches.

## 2022-03-28 ENCOUNTER — Encounter: Payer: Self-pay | Admitting: Family Medicine

## 2022-03-29 ENCOUNTER — Ambulatory Visit: Payer: Commercial Managed Care - HMO

## 2022-04-05 ENCOUNTER — Other Ambulatory Visit (HOSPITAL_COMMUNITY)
Admission: RE | Admit: 2022-04-05 | Discharge: 2022-04-05 | Disposition: A | Discharge status: Home / Self Care | Payer: Commercial Managed Care - HMO | Source: Ambulatory Visit | Attending: Advanced Practice Midwife | Admitting: Advanced Practice Midwife

## 2022-04-05 ENCOUNTER — Ambulatory Visit (INDEPENDENT_AMBULATORY_CARE_PROVIDER_SITE_OTHER): Payer: Commercial Managed Care - HMO

## 2022-04-05 VITALS — BP 105/73 | HR 78 | Wt 204.0 lb

## 2022-04-05 DIAGNOSIS — N898 Other specified noninflammatory disorders of vagina: Secondary | ICD-10-CM

## 2022-04-05 NOTE — Progress Notes (Signed)
SUBJECTIVE:  36 y.o. female complains of white vaginal discharge and postcoital bleeding. Denies abnormal vaginal bleeding or significant pelvic pain or fever. Denies history of known exposure to STD.  Declined STD testing by bloodwork.   LMP: 01/09/22  OBJECTIVE:  She appears alert, well appearing, in no apparent distress   ASSESSMENT:  Vaginal Discharge  Vaginal Odor   PLAN:  GC, chlamydia, trichomonas, BVAG, CVAG probe and urine culture sent to lab. Treatment: To be determined once lab results are received ROV prn if symptoms persist or worsen.

## 2022-04-06 LAB — CERVICOVAGINAL ANCILLARY ONLY
Bacterial Vaginitis (gardnerella): NEGATIVE
Candida Glabrata: NEGATIVE
Candida Vaginitis: NEGATIVE
Chlamydia: NEGATIVE
Comment: NEGATIVE
Comment: NEGATIVE
Comment: NEGATIVE
Comment: NEGATIVE
Comment: NEGATIVE
Comment: NORMAL
Neisseria Gonorrhea: NEGATIVE
Trichomonas: NEGATIVE

## 2022-04-15 ENCOUNTER — Ambulatory Visit: Payer: Commercial Managed Care - HMO | Admitting: Obstetrics and Gynecology

## 2022-04-15 NOTE — Progress Notes (Deleted)
NEW GYNECOLOGY PATIENT Patient name: Paula Dixon MRN [id number]  Date of birth: June 17, 1985 Chief Complaint:   No chief complaint on file.     History:  Paula Dixon is a 36 y.o. [phone] being seen today for ***.    HPI   ROS      Gynecologic History Patient's last menstrual period was 01/09/2022. Contraception: {method:5051} Last Pap: ***. Result was {norm/abn:16337} with negative HPV Last Mammogram: ***.  Result was {norm/abn:16337} Last Colonoscopy: ***.  Result was {norm/abn:16337}  Obstetric History OB History  Gravida Para Term Preterm AB Living  '4 1 1   2 1  '$ SAB IAB Ectopic Multiple Live Births    '1 1   1    '$ # Outcome Date GA Lbr Len/2nd Weight Sex Delivery Anes PTL Lv  4 Gravida           3 IAB 2020          2 Term 2018 [redacted]w[redacted]d  F Vag-Spont EPI N LIV  1 Ectopic 2016            Past Medical History:  Diagnosis Date   Angio-edema    Common migraine with intractable migraine 01/10/2020   Neuromuscular disorder ([place]    Vaginal Pap smear, abnormal     Past Surgical History:  Procedure Laterality Date   UPPER GASTROINTESTINAL ENDOSCOPY  08/18/2021    Current Outpatient Medications on File Prior to Visit  Medication Sig Dispense Refill   ondansetron (ZOFRAN-ODT) 8 MG disintegrating tablet Take 1 tablet (8 mg total) by mouth every 8 (eight) hours as needed for nausea or vomiting. (Patient not taking: Reported on 04/05/2022) 20 tablet 0   pantoprazole (PROTONIX) 40 MG tablet 40 mg PO BID for 6 weeks to promote healing of gastritis, then reduce to 40 mg daily to control reflux symptoms (Patient not taking: Reported on 04/05/2022) 90 tablet 3   sucralfate (CARAFATE) 1 g tablet Take 1 tablet (1 g total) by mouth 4 (four) times daily -  with meals and at bedtime. 120 tablet 0   SUMAtriptan (IMITREX) 50 MG tablet Take 1 tablet (50 mg total) by mouth every 2 (two) hours as needed for migraine. May repeat in 2 hours if headache persists or recurs. 10 tablet 5   No  current facility-administered medications on file prior to visit.    Allergies  Allergen Reactions   Fish Allergy Anaphylaxis    Social History:  reports that she has never smoked. She has never used smokeless tobacco. She reports that she does not currently use alcohol. She reports that she does not use drugs.  Family History  Problem Relation Age of Onset   Hypertension Mother    Throat cancer Father    Colon cancer Paternal Uncle    Stomach cancer Cousin    Breast cancer Other    Esophageal cancer Neg Hx    Rectal cancer Neg Hx     The following portions of the patient's history were reviewed and updated as appropriate: allergies, current medications, past family history, past medical history, past social history, past surgical history and problem list.  Review of Systems Pertinent items noted in HPI and remainder of comprehensive ROS otherwise negative.  Physical Exam:  LMP 01/09/2022  Physical Exam     Assessment and Plan:   1. Postcoital bleeding ***    Routine preventative health maintenance measures emphasized. Please refer to After Visit Summary for other counseling recommendations.      Dinita Migliaccio,  MD Obstetrician & Gynecologist, [facility] [employer], [place]

## 2022-05-17 ENCOUNTER — Ambulatory Visit: Payer: Commercial Managed Care - HMO

## 2022-05-19 ENCOUNTER — Ambulatory Visit (INDEPENDENT_AMBULATORY_CARE_PROVIDER_SITE_OTHER): Payer: Commercial Managed Care - HMO

## 2022-05-19 ENCOUNTER — Other Ambulatory Visit (HOSPITAL_COMMUNITY)
Admission: RE | Admit: 2022-05-19 | Discharge: 2022-05-19 | Disposition: A | Discharge status: Home / Self Care | Payer: Commercial Managed Care - HMO | Source: Ambulatory Visit | Attending: Family Medicine | Admitting: Family Medicine

## 2022-05-19 VITALS — BP 113/76 | HR 69 | Wt 203.0 lb

## 2022-05-19 DIAGNOSIS — N898 Other specified noninflammatory disorders of vagina: Secondary | ICD-10-CM | POA: Insufficient documentation

## 2022-05-19 NOTE — Progress Notes (Signed)
SUBJECTIVE:  36 y.o. female complains of white vaginal discharge for 5 day(s). Denies abnormal vaginal bleeding or significant pelvic pain or fever. No UTI symptoms. Denies history of known exposure to STD.  No LMP recorded.  OBJECTIVE:  She appears well, afebrile. Urine dipstick: negative for all components.  ASSESSMENT:  Vaginal Discharge  Vaginal Odor   PLAN:  GC, chlamydia, trichomonas, BVAG, CVAG probe sent to lab. Treatment: To be determined once lab results are received ROV prn if symptoms persist or worsen.

## 2022-05-20 ENCOUNTER — Other Ambulatory Visit: Payer: Self-pay | Admitting: Obstetrics and Gynecology

## 2022-05-20 DIAGNOSIS — B9689 Other specified bacterial agents as the cause of diseases classified elsewhere: Secondary | ICD-10-CM

## 2022-05-20 LAB — CERVICOVAGINAL ANCILLARY ONLY
Bacterial Vaginitis (gardnerella): POSITIVE — AB
Candida Glabrata: NEGATIVE
Candida Vaginitis: NEGATIVE
Chlamydia: NEGATIVE
Comment: NEGATIVE
Comment: NEGATIVE
Comment: NEGATIVE
Comment: NEGATIVE
Comment: NEGATIVE
Comment: NORMAL
Neisseria Gonorrhea: NEGATIVE
Trichomonas: NEGATIVE

## 2022-05-20 MED ORDER — METRONIDAZOLE 500 MG PO TABS: 500.0000 mg | ORAL_TABLET | Freq: Two times a day (BID) | ORAL | 0 refills | Status: DC

## 2022-05-20 NOTE — Progress Notes (Signed)
Rx for flagyl sent secondary to BV

## 2022-05-26 ENCOUNTER — Encounter: Payer: Self-pay | Admitting: Obstetrics and Gynecology

## 2022-05-26 ENCOUNTER — Ambulatory Visit (INDEPENDENT_AMBULATORY_CARE_PROVIDER_SITE_OTHER): Payer: Commercial Managed Care - HMO | Admitting: Obstetrics and Gynecology

## 2022-05-26 ENCOUNTER — Other Ambulatory Visit (HOSPITAL_COMMUNITY)
Admission: RE | Admit: 2022-05-26 | Discharge: 2022-05-26 | Disposition: A | Discharge status: Home / Self Care | Payer: Commercial Managed Care - HMO | Source: Ambulatory Visit | Attending: Obstetrics and Gynecology | Admitting: Obstetrics and Gynecology

## 2022-05-26 VITALS — BP 129/81 | HR 66 | Ht 64.0 in | Wt 206.0 lb

## 2022-05-26 DIAGNOSIS — F419 Anxiety disorder, unspecified: Secondary | ICD-10-CM

## 2022-05-26 DIAGNOSIS — N761 Subacute and chronic vaginitis: Secondary | ICD-10-CM | POA: Insufficient documentation

## 2022-05-26 DIAGNOSIS — N644 Mastodynia: Secondary | ICD-10-CM

## 2022-05-26 NOTE — Progress Notes (Signed)
36 yo P1 presenting today for the evaluation of postcoital vaginal bleeding and right breast pain. Patient reports some vaginal bleeding following intercourse, she also admits to dyspareunia with deep penetration at times. She is currently completing a course of Flagyl for suspected BV. Patient also reports a 1 year history of right breast pain. She describes the pain as a burning sensation with associated tenderness which interferes with her sleep habits or her daughter laying on her chest. This tenderness and pain is accentuated a few days prior to onset of menses. Patient reports that she had a marker placed in her right breast due to the presence of a benign cyst. The pain has been present since her procedure  Past Medical History:  Diagnosis Date   Angio-edema    Common migraine with intractable migraine 01/10/2020   Neuromuscular disorder ([place])    Vaginal Pap smear, abnormal    Past Surgical History:  Procedure Laterality Date   UPPER GASTROINTESTINAL ENDOSCOPY  08/18/2021   Family History  Problem Relation Age of Onset   Hypertension Mother    Throat cancer Father    Colon cancer Paternal Uncle    Stomach cancer Cousin    Breast cancer Other    Esophageal cancer Neg Hx    Rectal cancer Neg Hx    Social History   Tobacco Use   Smoking status: Never   Smokeless tobacco: Never  Vaping Use   Vaping Use: Never used  Substance Use Topics   Alcohol use: Not Currently    Comment: occasionally - few times per year, 1-2 drinks at parties   Drug use: Never   ROS See pertinent in HPI. All other systems reviewed and non contributory Blood pressure 129/81, pulse 66, height '5\' 4"'$  (1.626 m), weight 206 lb (93.4 kg), last menstrual period 05/12/2022. GENERAL: Well-developed, well-nourished female in no acute distress.  BREASTS: Symmetric in size. No palpable masses or lymphadenopathy, skin changes, or nipple drainage. ABDOMEN: Soft, nontender, nondistended. No organomegaly. PELVIC:  Normal external female genitalia. Vagina is pink and rugated.  Normal discharge. Normal appearing cervix. Uterus is normal in size. No adnexal mass or tenderness. Chaperone present during the pelvic exam EXTREMITIES: No cyanosis, clubbing, or edema, 2+ distal pulses.  A/P 36 yo with postcoital vaginal bleeding, recent BV infection and right breast pain - Vaginal swab collected to rule out infectious process - Patient referred to breast center for diagnostic right mammogram and ultrasound along with further management of her pain - patient requesting referral to behavioral health

## 2022-05-26 NOTE — Progress Notes (Signed)
Patient states she has bleeding with intercourse and has some breast pain- burning in nature. Kathrene Alu RN

## 2022-05-27 LAB — CERVICOVAGINAL ANCILLARY ONLY
Bacterial Vaginitis (gardnerella): NEGATIVE
Candida Glabrata: NEGATIVE
Candida Vaginitis: POSITIVE — AB
Chlamydia: NEGATIVE
Comment: NEGATIVE
Comment: NEGATIVE
Comment: NEGATIVE
Comment: NEGATIVE
Comment: NEGATIVE
Comment: NORMAL
Neisseria Gonorrhea: NEGATIVE
Trichomonas: NEGATIVE

## 2022-05-31 MED ORDER — FLUCONAZOLE 150 MG PO TABS: 150.0000 mg | ORAL_TABLET | Freq: Once | ORAL | 0 refills | Status: AC

## 2022-05-31 NOTE — Addendum Note (Signed)
Addended by: Mora Bellman on: 05/31/2022 05:19 PM   Modules accepted: Orders

## 2022-06-09 ENCOUNTER — Ambulatory Visit (INDEPENDENT_AMBULATORY_CARE_PROVIDER_SITE_OTHER): Payer: 59 | Admitting: Licensed Clinical Social Worker

## 2022-06-09 DIAGNOSIS — F419 Anxiety disorder, unspecified: Secondary | ICD-10-CM | POA: Diagnosis not present

## 2022-06-11 NOTE — BH Specialist Note (Signed)
Integrated Behavioral Health via Telemedicine Visit  06/11/2022 Cataleyah Colborn [id number]  Number of Integrated Behavioral Health Clinician visits: 1 Session Start time:  2:30pm Session End time: 3:04pm Total time in minutes:  34 mins via mychart video    Referring Provider: Dr. Elly Modena  Patient/Family location: Home  [facility] Provider location: [place]  All persons participating in visit: Pt Jerolyn Shin and LCSW A Linton Rump  Types of Service: Individual psychotherapy and Video visit  I connected with Janalyn Harder and/or Kerin Salen Mullenbach's n/a via  Telephone or [employer]  (Video is Caregility application) and verified that I am speaking with the correct person using two identifiers. Discussed confidentiality: Yes   I discussed the limitations of telemedicine and the availability of in person appointments.  Discussed there is a possibility of technology failure and discussed alternative modes of communication if that failure occurs.  I discussed that engaging in this telemedicine visit, they consent to the provision of behavioral healthcare and the services will be billed under their insurance.  Patient and/or legal guardian expressed understanding and consented to Telemedicine visit: Yes   Presenting Concerns: Patient and/or family reports the following symptoms/concerns: depression Duration of problem: approx one year ; Severity of problem: mild  Patient and/or Family's Strengths/Protective Factors: Concrete supports in place (healthy food, safe environments, etc.)  Goals Addressed: Patient will:  Reduce symptoms of: anxiety   Increase knowledge and/or ability of: coping skills   Demonstrate ability to: Increase healthy adjustment to current life circumstances  Progress towards Goals: Ongoing  Interventions: Interventions utilized:  Motivational Interviewing and Supportive Counseling Standardized Assessments completed: Not Needed  Patient and/or Family  Response: Ms. [place] reports occasional depressed mood. Ms. Ferriss reports worry about finances, pending relocation and future career plans.    Assessment: Patient currently experiencing anxiety .   Patient may benefit from integrated behavioral health.  Plan: Follow up with behavioral health clinician on : 2-3 weeks  Behavioral recommendations: collaborate with a career counselor, journal writing,  Referral(s): [place] (In Clinic)  I discussed the assessment and treatment plan with the patient and/or parent/guardian. They were provided an opportunity to ask questions and all were answered. They agreed with the plan and demonstrated an understanding of the instructions.   They were advised to call back or seek an in-person evaluation if the symptoms worsen or if the condition fails to improve as anticipated.  Lynnea Ferrier, LCSW

## 2022-06-17 ENCOUNTER — Other Ambulatory Visit: Payer: Self-pay | Admitting: Obstetrics and Gynecology

## 2022-06-17 DIAGNOSIS — B9689 Other specified bacterial agents as the cause of diseases classified elsewhere: Secondary | ICD-10-CM

## 2022-06-28 ENCOUNTER — Other Ambulatory Visit: Payer: Self-pay | Admitting: Obstetrics and Gynecology

## 2022-06-28 DIAGNOSIS — B9689 Other specified bacterial agents as the cause of diseases classified elsewhere: Secondary | ICD-10-CM

## 2022-06-29 ENCOUNTER — Other Ambulatory Visit: Payer: Self-pay

## 2022-06-29 MED ORDER — FLUCONAZOLE 150 MG PO TABS: 150.0000 mg | ORAL_TABLET | Freq: Once | ORAL | 0 refills | Status: AC

## 2022-06-30 ENCOUNTER — Encounter: Payer: Self-pay | Admitting: Internal Medicine

## 2022-06-30 ENCOUNTER — Encounter: Payer: Medicaid Other | Admitting: Licensed Clinical Social Worker

## 2022-06-30 MED ORDER — METRONIDAZOLE 500 MG PO TABS: 500.0000 mg | ORAL_TABLET | Freq: Two times a day (BID) | ORAL | 0 refills | Status: AC

## 2022-07-06 ENCOUNTER — Encounter: Payer: Medicaid Other | Admitting: Licensed Clinical Social Worker

## 2022-07-08 ENCOUNTER — Ambulatory Visit
Admission: EM | Admit: 2022-07-08 | Discharge: 2022-07-08 | Disposition: A | Discharge status: Home / Self Care | Payer: 59 | Attending: Family Medicine | Admitting: Family Medicine

## 2022-07-08 ENCOUNTER — Encounter: Payer: Self-pay | Admitting: Emergency Medicine

## 2022-07-08 DIAGNOSIS — J069 Acute upper respiratory infection, unspecified: Secondary | ICD-10-CM | POA: Insufficient documentation

## 2022-07-08 DIAGNOSIS — Z1152 Encounter for screening for COVID-19: Secondary | ICD-10-CM | POA: Insufficient documentation

## 2022-07-08 DIAGNOSIS — M940 Chondrocostal junction syndrome [Tietze]: Secondary | ICD-10-CM | POA: Diagnosis not present

## 2022-07-08 DIAGNOSIS — R059 Cough, unspecified: Secondary | ICD-10-CM | POA: Insufficient documentation

## 2022-07-08 MED ORDER — AZITHROMYCIN 250 MG PO TABS: ORAL_TABLET | ORAL | 0 refills | Status: AC

## 2022-07-08 NOTE — ED Provider Notes (Signed)
[facility]    CSN: [id number] Arrival date & time: 07/08/22  8338      History   Chief Complaint Chief Complaint  Patient presents with   Cough    HPI Paula Dixon is a 37 y.o. female.   One week ago patient developed sneezing, nasal congestion, sore throat, and cough.  During the past four days she has had myalgias and fatigue.  She denies fever, shortness of breath, and pleuritic pain but does have tightness in her anterior chest.  The history is provided by the patient.    Past Medical History:  Diagnosis Date   Angio-edema    Common migraine with intractable migraine 01/10/2020   Neuromuscular disorder ([place])    Vaginal Pap smear, abnormal     Patient Active Problem List   Diagnosis Date Noted   Migraines 06/25/2021   Viral URI with cough 06/25/2021   Capsulitis of left shoulder 06/17/2021   Breast mass, right 12/29/2020   Low back pain 03/03/2020   Oral contraceptive prescribed 03/03/2020   Depression 03/03/2020   Common migraine with intractable migraine 01/10/2020    Past Surgical History:  Procedure Laterality Date   UPPER GASTROINTESTINAL ENDOSCOPY  08/18/2021    OB History     Gravida  4   Para  1   Term  1   Preterm      AB  2   Living  1      SAB      IAB  1   Ectopic  1   Multiple      Live Births  1            Home Medications    Prior to Admission medications   Medication Sig Start Date End Date Taking? Authorizing Provider  azithromycin (ZITHROMAX Z-PAK) 250 MG tablet Take 2 tabs today; then begin one tab once daily for 4 more days. 07/08/22  Yes Kandra Nicolas, MD  metroNIDAZOLE (FLAGYL) 500 MG tablet Take 1 tablet (500 mg total) by mouth 2 (two) times daily. 06/30/22   Truett Mainland, DO  ondansetron (ZOFRAN-ODT) 8 MG disintegrating tablet Take 1 tablet (8 mg total) by mouth every 8 (eight) hours as needed for nausea or vomiting. Patient not taking: Reported on 04/05/2022 01/31/22   Raylene Everts, MD  pantoprazole (PROTONIX) 40 MG tablet 40 mg PO BID for 6 weeks to promote healing of gastritis, then reduce to 40 mg daily to control reflux symptoms Patient not taking: Reported on 04/05/2022 08/18/21   Cirigliano, Vito V, DO  sucralfate (CARAFATE) 1 g tablet Take 1 tablet (1 g total) by mouth 4 (four) times daily -  with meals and at bedtime. 08/18/21 09/17/21  Cirigliano, Vito V, DO  SUMAtriptan (IMITREX) 50 MG tablet Take 1 tablet (50 mg total) by mouth every 2 (two) hours as needed for migraine. May repeat in 2 hours if headache persists or recurs. 06/25/21   Debbrah Alar, NP    Family History Family History  Problem Relation Age of Onset   Hypertension Mother    Throat cancer Father    Colon cancer Paternal Uncle    Stomach cancer Cousin    Breast cancer Other    Esophageal cancer Neg Hx    Rectal cancer Neg Hx     Social History Social History   Tobacco Use   Smoking status: Never   Smokeless tobacco: Never  Vaping Use   Vaping Use: Never used  Substance  Use Topics   Alcohol use: Not Currently    Comment: occasionally - few times per year, 1-2 drinks at parties   Drug use: Never     Allergies   Fish allergy   Review of Systems Review of Systems + sore throat + cough + sneezing No pleuritic pain No wheezing + nasal congestion + post-nasal drainage No sinus pain/pressure No itchy/red eyes No earache No hemoptysis No SOB + low grade fever No nausea No vomiting No abdominal pain No diarrhea No urinary symptoms No skin rash + fatigue + myalgias No headache Used OTC meds (ibuprofen, cold/flu preparation) without relief   Physical Exam Triage Vital Signs ED Triage Vitals [07/08/22 0950]  Enc Vitals Group     BP (!) 132/98     Pulse Rate 72     Resp 16     Temp 98.8 F (37.1 C)     Temp Source Oral     SpO2 98 %     Weight      Height      Head Circumference      Peak Flow      Pain Score      Pain Loc      Pain Edu?       Excl. in [place]?    No data found.  Updated Vital Signs BP (!) 132/98 (BP Location: Left Arm)   Pulse 72   Temp 98.8 F (37.1 C) (Oral)   Resp 16   SpO2 98%   Visual Acuity Right Eye Distance:   Left Eye Distance:   Bilateral Distance:    Right Eye Near:   Left Eye Near:    Bilateral Near:     Physical Exam Nursing notes and Vital Signs reviewed. Appearance:  Patient appears stated age, and in no acute distress Eyes:  Pupils are equal, round, and reactive to light and accomodation.  Extraocular movement is intact.  Conjunctivae are not inflamed  Ears:  Canals normal.  Tympanic membranes normal.  Nose:  Mildly congested turbinates.  No sinus tenderness.  Pharynx:  Normal Neck:  Supple.  Mildly enlarged lateral nodes are present, tender to palpation on the left.   Lungs:  Clear to auscultation.  Breath sounds are equal.  Moving air well. Chest:  Distinct tenderness to palpation over the mid-sternum.  Heart:  Regular rate and rhythm without murmurs, rubs, or gallops.  Abdomen:  Nontender without masses or hepatosplenomegaly.  Bowel sounds are present.  No CVA or flank tenderness.  Extremities:  No edema.  Skin:  No rash present.   UC Treatments / Results  Labs (all labs ordered are listed, but only abnormal results are displayed) Labs Reviewed  SARS CORONAVIRUS 2 (TAT 6-24 HRS)    EKG   Radiology No results found.  Procedures Procedures (including critical care time)  Medications Ordered in UC Medications - No data to display  Initial Impression / Assessment and Plan / UC Course  I have reviewed the triage vital signs and the nursing notes.  Pertinent labs & imaging results that were available during my care of the patient were reviewed by me and considered in my medical decision making (see chart for details).    Benign exam. Treat symptomatically for now. COVID19 PCR pending. Given Rx for Z-pack to hold. Followup with Family Doctor if not improved in about  10 days.  Final Clinical Impressions(s) / UC Diagnoses   Final diagnoses:  Viral URI with cough  Costochondritis  Discharge Instructions      Take plain guaifenesin ('1200mg'$  extended release tabs such as Mucinex) twice daily, with plenty of water, for cough and congestion.  May add Pseudoephedrine ('30mg'$ , one or two every 4 to 6 hours) for sinus congestion.  Get adequate rest.   May take Delsym Cough Suppressant ("12 Hour Cough Relief") at bedtime for nighttime cough.  Try warm salt water gargles for sore throat.  Stop all antihistamines (Nyquil, etc) for now, and other non-prescription cough/cold preparations. May take Ibuprofen '200mg'$ , 4 tabs every 8 hours with food for chest/sternum discomfort. Begin Azithromycin if not improving about one week or if persistent fever develops    If your COVID-19 test is positive, isolate yourself for five days from today.  At the end of five days you may end isolation if your symptoms have cleared or improved, and you have not had a fever for 24 hours. At this time you should wear a mask for five more days when you are around others.             ED Prescriptions     Medication Sig Dispense Auth. Provider   azithromycin (ZITHROMAX Z-PAK) 250 MG tablet Take 2 tabs today; then begin one tab once daily for 4 more days. 6 tablet Kandra Nicolas, MD         Kandra Nicolas, MD 07/10/22 [phone]

## 2022-07-08 NOTE — Discharge Instructions (Addendum)
Take plain guaifenesin ('1200mg'$  extended release tabs such as Mucinex) twice daily, with plenty of water, for cough and congestion.  May add Pseudoephedrine ('30mg'$ , one or two every 4 to 6 hours) for sinus congestion.  Get adequate rest.   May take Delsym Cough Suppressant ("12 Hour Cough Relief") at bedtime for nighttime cough.  Try warm salt water gargles for sore throat.  Stop all antihistamines (Nyquil, etc) for now, and other non-prescription cough/cold preparations. May take Ibuprofen '200mg'$ , 4 tabs every 8 hours with food for chest/sternum discomfort. Begin Azithromycin if not improving about one week or if persistent fever develops    If your COVID-19 test is positive, isolate yourself for five days from today.  At the end of five days you may end isolation if your symptoms have cleared or improved, and you have not had a fever for 24 hours. At this time you should wear a mask for five more days when you are around others.

## 2022-07-08 NOTE — ED Triage Notes (Signed)
Since Saturday c/o sneezing, coughing, nasal congestion, sore throat, generalized body aches. Denies headache, fever, SOB, wheezing. Taking [employer], Dayquil, and ibuprofen to manage symptoms

## 2022-07-09 LAB — SARS CORONAVIRUS 2 (TAT 6-24 HRS): SARS Coronavirus 2: NEGATIVE

## 2022-07-11 DIAGNOSIS — F4320 Adjustment disorder, unspecified: Secondary | ICD-10-CM | POA: Diagnosis not present

## 2022-07-25 DIAGNOSIS — F4320 Adjustment disorder, unspecified: Secondary | ICD-10-CM | POA: Diagnosis not present

## 2022-08-08 DIAGNOSIS — F4320 Adjustment disorder, unspecified: Secondary | ICD-10-CM | POA: Diagnosis not present

## 2022-08-22 DIAGNOSIS — F4320 Adjustment disorder, unspecified: Secondary | ICD-10-CM | POA: Diagnosis not present

## 2022-09-12 DIAGNOSIS — F4320 Adjustment disorder, unspecified: Secondary | ICD-10-CM | POA: Diagnosis not present

## 2022-09-19 ENCOUNTER — Encounter: Payer: Self-pay | Admitting: *Deleted

## 2022-09-26 DIAGNOSIS — F4320 Adjustment disorder, unspecified: Secondary | ICD-10-CM | POA: Diagnosis not present

## 2022-10-17 DIAGNOSIS — F4320 Adjustment disorder, unspecified: Secondary | ICD-10-CM | POA: Diagnosis not present

## 2022-10-26 ENCOUNTER — Ambulatory Visit (INDEPENDENT_AMBULATORY_CARE_PROVIDER_SITE_OTHER): Payer: 59

## 2022-10-26 ENCOUNTER — Other Ambulatory Visit (HOSPITAL_COMMUNITY)
Admission: RE | Admit: 2022-10-26 | Discharge: 2022-10-26 | Disposition: A | Discharge status: Home / Self Care | Payer: 59 | Source: Ambulatory Visit | Attending: Obstetrics & Gynecology | Admitting: Obstetrics & Gynecology

## 2022-10-26 VITALS — BP 121/79 | HR 81 | Wt 199.0 lb

## 2022-10-26 DIAGNOSIS — N898 Other specified noninflammatory disorders of vagina: Secondary | ICD-10-CM | POA: Insufficient documentation

## 2022-10-26 DIAGNOSIS — R8290 Unspecified abnormal findings in urine: Secondary | ICD-10-CM

## 2022-10-26 DIAGNOSIS — Z3202 Encounter for pregnancy test, result negative: Secondary | ICD-10-CM

## 2022-10-26 LAB — POCT URINALYSIS DIPSTICK
Bilirubin, UA: NEGATIVE
Blood, UA: NEGATIVE
Glucose, UA: NEGATIVE
Ketones, UA: POSITIVE
Leukocytes, UA: NEGATIVE
Nitrite, UA: NEGATIVE
Protein, UA: POSITIVE — AB
Spec Grav, UA: 1.02 (ref 1.010–1.025)
Urobilinogen, UA: 0.2 E.U./dL
pH, UA: 6 (ref 5.0–8.0)

## 2022-10-26 LAB — POCT URINE PREGNANCY: Preg Test, Ur: NEGATIVE

## 2022-10-26 NOTE — Progress Notes (Addendum)
SUBJECTIVE:  37 y.o. female complains of clear vaginal discharge for 2 week(s). Denies abnormal vaginal bleeding or significant pelvic pain or fever. No UTI symptoms. Denies history of known exposure to STD.  Patient's last menstrual period was 10/02/2022 (exact date).  OBJECTIVE:  She appears well, afebrile. Urine dipstick: positive for protein.  ASSESSMENT:  Vaginal itching    PLAN:  GC, chlamydia, trichomonas, BVAG, CVAG probe sent to lab. Treatment: To be determined once lab results are received ROV prn if symptoms persist or worsen.   Attestation of Attending Supervision of CMA: Evaluation and management procedures were performed by the nurse under my supervision and collaboration.  I have reviewed the nursing note and chart, and I agree with the management and plan.  Carolyn L. Harraway-Smith, M.D., [facility]

## 2022-10-28 LAB — CERVICOVAGINAL ANCILLARY ONLY
Bacterial Vaginitis (gardnerella): NEGATIVE
Candida Glabrata: NEGATIVE
Candida Vaginitis: NEGATIVE
Chlamydia: NEGATIVE
Comment: NEGATIVE
Comment: NEGATIVE
Comment: NEGATIVE
Comment: NEGATIVE
Comment: NEGATIVE
Comment: NORMAL
Neisseria Gonorrhea: NEGATIVE
Trichomonas: NEGATIVE

## 2022-11-11 DIAGNOSIS — F4320 Adjustment disorder, unspecified: Secondary | ICD-10-CM | POA: Diagnosis not present

## 2022-11-28 DIAGNOSIS — F4320 Adjustment disorder, unspecified: Secondary | ICD-10-CM | POA: Diagnosis not present

## 2022-12-19 DIAGNOSIS — F4320 Adjustment disorder, unspecified: Secondary | ICD-10-CM | POA: Diagnosis not present

## 2023-01-02 DIAGNOSIS — F4320 Adjustment disorder, unspecified: Secondary | ICD-10-CM | POA: Diagnosis not present

## 2023-01-23 DIAGNOSIS — F4320 Adjustment disorder, unspecified: Secondary | ICD-10-CM | POA: Diagnosis not present

## 2023-02-10 DIAGNOSIS — F4320 Adjustment disorder, unspecified: Secondary | ICD-10-CM | POA: Diagnosis not present

## 2023-02-27 DIAGNOSIS — F4320 Adjustment disorder, unspecified: Secondary | ICD-10-CM | POA: Diagnosis not present

## 2023-03-20 DIAGNOSIS — F4320 Adjustment disorder, unspecified: Secondary | ICD-10-CM | POA: Diagnosis not present

## 2023-04-01 DIAGNOSIS — O00109 Unspecified tubal pregnancy without intrauterine pregnancy: Secondary | ICD-10-CM | POA: Diagnosis not present

## 2023-04-01 DIAGNOSIS — R9389 Abnormal findings on diagnostic imaging of other specified body structures: Secondary | ICD-10-CM | POA: Diagnosis not present

## 2023-04-01 DIAGNOSIS — R1031 Right lower quadrant pain: Secondary | ICD-10-CM | POA: Diagnosis not present

## 2023-04-01 DIAGNOSIS — Z3A00 Weeks of gestation of pregnancy not specified: Secondary | ICD-10-CM | POA: Diagnosis not present

## 2023-04-01 DIAGNOSIS — R109 Unspecified abdominal pain: Secondary | ICD-10-CM | POA: Diagnosis not present

## 2023-04-01 DIAGNOSIS — R102 Pelvic and perineal pain: Secondary | ICD-10-CM | POA: Diagnosis not present

## 2023-04-01 DIAGNOSIS — R1032 Left lower quadrant pain: Secondary | ICD-10-CM | POA: Diagnosis not present

## 2023-04-01 DIAGNOSIS — O00101 Right tubal pregnancy without intrauterine pregnancy: Secondary | ICD-10-CM | POA: Diagnosis not present

## 2023-04-01 DIAGNOSIS — O26891 Other specified pregnancy related conditions, first trimester: Secondary | ICD-10-CM | POA: Diagnosis not present

## 2023-04-01 DIAGNOSIS — O009 Unspecified ectopic pregnancy without intrauterine pregnancy: Secondary | ICD-10-CM | POA: Diagnosis not present

## 2023-04-01 DIAGNOSIS — N836 Hematosalpinx: Secondary | ICD-10-CM | POA: Diagnosis not present

## 2023-04-01 DIAGNOSIS — R1030 Lower abdominal pain, unspecified: Secondary | ICD-10-CM | POA: Diagnosis not present

## 2023-04-09 DIAGNOSIS — Z9889 Other specified postprocedural states: Secondary | ICD-10-CM | POA: Diagnosis not present

## 2023-04-09 DIAGNOSIS — K661 Hemoperitoneum: Secondary | ICD-10-CM | POA: Diagnosis not present

## 2023-04-09 DIAGNOSIS — R109 Unspecified abdominal pain: Secondary | ICD-10-CM | POA: Diagnosis not present

## 2023-04-09 DIAGNOSIS — R1030 Lower abdominal pain, unspecified: Secondary | ICD-10-CM | POA: Diagnosis not present

## 2023-04-09 DIAGNOSIS — R1032 Left lower quadrant pain: Secondary | ICD-10-CM | POA: Diagnosis not present

## 2023-04-09 DIAGNOSIS — N83201 Unspecified ovarian cyst, right side: Secondary | ICD-10-CM | POA: Diagnosis not present

## 2023-04-10 DIAGNOSIS — F4320 Adjustment disorder, unspecified: Secondary | ICD-10-CM | POA: Diagnosis not present

## 2023-04-13 DIAGNOSIS — O009 Unspecified ectopic pregnancy without intrauterine pregnancy: Secondary | ICD-10-CM | POA: Diagnosis not present

## 2023-04-14 DIAGNOSIS — Z09 Encounter for follow-up examination after completed treatment for conditions other than malignant neoplasm: Secondary | ICD-10-CM | POA: Diagnosis not present

## 2023-05-18 DIAGNOSIS — N949 Unspecified condition associated with female genital organs and menstrual cycle: Secondary | ICD-10-CM | POA: Diagnosis not present
